# Patient Record
Sex: Male | Born: 1940 | Race: White | Hispanic: No | Marital: Married
Health system: Southern US, Community
[De-identification: ages and names within clinical notes are randomized; demographics above are authoritative.]

## PROBLEM LIST (undated history)

## (undated) DIAGNOSIS — E119 Type 2 diabetes mellitus without complications: Secondary | ICD-10-CM

## (undated) DIAGNOSIS — I10 Essential (primary) hypertension: Secondary | ICD-10-CM

## (undated) DIAGNOSIS — I340 Nonrheumatic mitral (valve) insufficiency: Secondary | ICD-10-CM

## (undated) DIAGNOSIS — J449 Chronic obstructive pulmonary disease, unspecified: Secondary | ICD-10-CM

## (undated) HISTORY — DX: Chronic obstructive pulmonary disease, unspecified: J44.9

## (undated) HISTORY — DX: Nonrheumatic mitral (valve) insufficiency: I34.0

## (undated) HISTORY — DX: Essential (primary) hypertension: I10

## (undated) HISTORY — DX: Type 2 diabetes mellitus without complications: E11.9

---

## 1997-12-18 ENCOUNTER — Ambulatory Visit (HOSPITAL_COMMUNITY): Admission: RE | Admit: 1997-12-18 | Discharge: 1997-12-18 | Payer: Self-pay | Admitting: Cardiology

## 1998-01-03 ENCOUNTER — Encounter: Admission: RE | Admit: 1998-01-03 | Discharge: 1998-04-03 | Payer: Self-pay | Admitting: Family Medicine

## 1998-06-25 ENCOUNTER — Ambulatory Visit (HOSPITAL_COMMUNITY): Admission: RE | Admit: 1998-06-25 | Discharge: 1998-06-25 | Payer: Self-pay | Admitting: Cardiology

## 1998-06-25 ENCOUNTER — Encounter: Payer: Self-pay | Admitting: Cardiology

## 2000-02-06 ENCOUNTER — Ambulatory Visit (HOSPITAL_COMMUNITY): Admission: RE | Admit: 2000-02-06 | Discharge: 2000-02-06 | Payer: Self-pay | Admitting: Gastroenterology

## 2000-08-18 ENCOUNTER — Encounter: Payer: Self-pay | Admitting: Cardiology

## 2000-08-18 ENCOUNTER — Encounter: Admission: RE | Admit: 2000-08-18 | Discharge: 2000-08-18 | Payer: Self-pay | Admitting: Cardiology

## 2000-08-19 ENCOUNTER — Encounter: Payer: Self-pay | Admitting: Cardiology

## 2000-08-19 ENCOUNTER — Encounter: Admission: RE | Admit: 2000-08-19 | Discharge: 2000-08-19 | Payer: Self-pay | Admitting: Cardiology

## 2001-01-05 ENCOUNTER — Encounter: Payer: Self-pay | Admitting: Cardiology

## 2001-01-05 ENCOUNTER — Encounter: Admission: RE | Admit: 2001-01-05 | Discharge: 2001-01-05 | Payer: Self-pay | Admitting: Cardiology

## 2001-04-07 ENCOUNTER — Inpatient Hospital Stay (HOSPITAL_COMMUNITY): Admission: AD | Admit: 2001-04-07 | Discharge: 2001-04-09 | Payer: Self-pay | Admitting: Cardiology

## 2001-05-12 ENCOUNTER — Inpatient Hospital Stay (HOSPITAL_COMMUNITY): Admission: EM | Admit: 2001-05-12 | Discharge: 2001-05-13 | Payer: Self-pay | Admitting: Emergency Medicine

## 2001-05-12 ENCOUNTER — Encounter: Payer: Self-pay | Admitting: *Deleted

## 2001-05-13 ENCOUNTER — Encounter: Payer: Self-pay | Admitting: *Deleted

## 2001-05-13 ENCOUNTER — Inpatient Hospital Stay (HOSPITAL_COMMUNITY): Admission: EM | Admit: 2001-05-13 | Discharge: 2001-05-16 | Payer: Self-pay | Admitting: Psychiatry

## 2001-05-17 ENCOUNTER — Other Ambulatory Visit (HOSPITAL_COMMUNITY): Admission: RE | Admit: 2001-05-17 | Discharge: 2001-05-21 | Payer: Self-pay | Admitting: Psychiatry

## 2001-07-26 ENCOUNTER — Encounter: Payer: Self-pay | Admitting: Emergency Medicine

## 2001-07-26 ENCOUNTER — Emergency Department (HOSPITAL_COMMUNITY): Admission: EM | Admit: 2001-07-26 | Discharge: 2001-07-26 | Payer: Self-pay | Admitting: Emergency Medicine

## 2001-11-17 ENCOUNTER — Encounter: Payer: Self-pay | Admitting: Internal Medicine

## 2001-11-17 ENCOUNTER — Ambulatory Visit (HOSPITAL_COMMUNITY): Admission: RE | Admit: 2001-11-17 | Discharge: 2001-11-17 | Payer: Self-pay | Admitting: Internal Medicine

## 2002-03-21 ENCOUNTER — Encounter: Payer: Self-pay | Admitting: Internal Medicine

## 2002-03-21 ENCOUNTER — Encounter: Admission: RE | Admit: 2002-03-21 | Discharge: 2002-03-21 | Payer: Self-pay | Admitting: Internal Medicine

## 2003-08-02 ENCOUNTER — Encounter: Admission: RE | Admit: 2003-08-02 | Discharge: 2003-08-02 | Payer: Self-pay | Admitting: Internal Medicine

## 2004-05-21 ENCOUNTER — Inpatient Hospital Stay (HOSPITAL_COMMUNITY): Admission: AD | Admit: 2004-05-21 | Discharge: 2004-05-23 | Payer: Self-pay | Admitting: Cardiology

## 2004-06-23 ENCOUNTER — Emergency Department (HOSPITAL_COMMUNITY): Admission: EM | Admit: 2004-06-23 | Discharge: 2004-06-24 | Payer: Self-pay | Admitting: Emergency Medicine

## 2004-06-26 ENCOUNTER — Encounter: Admission: RE | Admit: 2004-06-26 | Discharge: 2004-06-26 | Payer: Self-pay | Admitting: Urology

## 2004-06-27 ENCOUNTER — Ambulatory Visit (HOSPITAL_COMMUNITY): Admission: RE | Admit: 2004-06-27 | Discharge: 2004-06-27 | Payer: Self-pay | Admitting: Urology

## 2004-06-27 ENCOUNTER — Encounter (INDEPENDENT_AMBULATORY_CARE_PROVIDER_SITE_OTHER): Payer: Self-pay | Admitting: Specialist

## 2004-06-27 ENCOUNTER — Ambulatory Visit (HOSPITAL_BASED_OUTPATIENT_CLINIC_OR_DEPARTMENT_OTHER): Admission: RE | Admit: 2004-06-27 | Discharge: 2004-06-27 | Payer: Self-pay | Admitting: Urology

## 2005-12-15 ENCOUNTER — Inpatient Hospital Stay (HOSPITAL_COMMUNITY): Admission: EM | Admit: 2005-12-15 | Discharge: 2005-12-17 | Payer: Self-pay | Admitting: Emergency Medicine

## 2005-12-25 ENCOUNTER — Ambulatory Visit: Payer: Self-pay | Admitting: Psychiatry

## 2005-12-25 ENCOUNTER — Inpatient Hospital Stay (HOSPITAL_COMMUNITY): Admission: RE | Admit: 2005-12-25 | Discharge: 2005-12-29 | Payer: Self-pay | Admitting: Psychiatry

## 2007-01-14 ENCOUNTER — Ambulatory Visit (HOSPITAL_COMMUNITY): Admission: RE | Admit: 2007-01-14 | Discharge: 2007-01-14 | Payer: Self-pay | Admitting: Internal Medicine

## 2008-03-30 ENCOUNTER — Inpatient Hospital Stay (HOSPITAL_COMMUNITY): Admission: EM | Admit: 2008-03-30 | Discharge: 2008-04-03 | Payer: Self-pay | Admitting: Emergency Medicine

## 2008-03-30 ENCOUNTER — Ambulatory Visit: Payer: Self-pay | Admitting: Internal Medicine

## 2008-04-03 ENCOUNTER — Ambulatory Visit: Payer: Self-pay | Admitting: *Deleted

## 2008-04-03 ENCOUNTER — Inpatient Hospital Stay (HOSPITAL_COMMUNITY): Admission: RE | Admit: 2008-04-03 | Discharge: 2008-04-05 | Payer: Self-pay | Admitting: *Deleted

## 2009-12-14 ENCOUNTER — Inpatient Hospital Stay (HOSPITAL_COMMUNITY): Admission: EM | Admit: 2009-12-14 | Discharge: 2009-12-21 | Payer: Self-pay | Admitting: Emergency Medicine

## 2010-04-29 ENCOUNTER — Encounter: Payer: Self-pay | Admitting: Internal Medicine

## 2010-05-13 DIAGNOSIS — J449 Chronic obstructive pulmonary disease, unspecified: Secondary | ICD-10-CM | POA: Insufficient documentation

## 2010-05-13 DIAGNOSIS — I080 Rheumatic disorders of both mitral and aortic valves: Secondary | ICD-10-CM | POA: Insufficient documentation

## 2010-05-13 DIAGNOSIS — I428 Other cardiomyopathies: Secondary | ICD-10-CM | POA: Insufficient documentation

## 2010-05-13 DIAGNOSIS — J4489 Other specified chronic obstructive pulmonary disease: Secondary | ICD-10-CM | POA: Insufficient documentation

## 2010-05-13 DIAGNOSIS — I10 Essential (primary) hypertension: Secondary | ICD-10-CM | POA: Insufficient documentation

## 2010-05-13 DIAGNOSIS — E119 Type 2 diabetes mellitus without complications: Secondary | ICD-10-CM | POA: Insufficient documentation

## 2010-05-14 ENCOUNTER — Ambulatory Visit: Payer: Self-pay | Admitting: Internal Medicine

## 2010-05-21 NOTE — Letter (Signed)
Summary: [facility] - 2010 to 2012  [facility] - 2010 to 2012   Imported By: Marylou Mccoy 05/17/2010 14:05:49  _____________________________________________________________________  External Attachment:    Type:   Image     Comment:   External Document

## 2010-05-22 LAB — TSH: TSH: 1.841 u[IU]/mL (ref 0.350–4.500)

## 2010-05-22 LAB — CARDIAC PANEL(CRET KIN+CKTOT+MB+TROPI)
CK, MB: 4.4 ng/mL — ABNORMAL HIGH (ref 0.3–4.0)
Relative Index: 2.5 (ref 0.0–2.5)
Total CK: 176 U/L (ref 7–232)
Troponin I: 0.09 ng/mL — ABNORMAL HIGH (ref 0.00–0.06)

## 2010-05-22 LAB — URINALYSIS, ROUTINE W REFLEX MICROSCOPIC
Glucose, UA: >1000 mg/dL — AB
Ketones, ur: 15 mg/dL — AB
Leukocytes, UA: NEGATIVE
Nitrite: NEGATIVE
Protein, ur: NEGATIVE mg/dL

## 2010-05-22 LAB — HEPATIC FUNCTION PANEL
AST: 37 U/L (ref 0–37)
Bilirubin, Direct: 0.3 mg/dL (ref 0.0–0.3)
Indirect Bilirubin: 0.8 mg/dL (ref 0.3–0.9)

## 2010-05-22 LAB — CBC
HCT: 31.8 % — ABNORMAL LOW (ref 39.0–52.0)
HCT: 34.2 % — ABNORMAL LOW (ref 39.0–52.0)
HCT: 36.4 % — ABNORMAL LOW (ref 39.0–52.0)
Hemoglobin: 10.6 g/dL — ABNORMAL LOW (ref 13.0–17.0)
Hemoglobin: 11 g/dL — ABNORMAL LOW (ref 13.0–17.0)
Hemoglobin: 11.4 g/dL — ABNORMAL LOW (ref 13.0–17.0)
Hemoglobin: 12.4 g/dL — ABNORMAL LOW (ref 13.0–17.0)
MCH: 30.1 pg (ref 26.0–34.0)
MCH: 31.5 pg (ref 26.0–34.0)
MCHC: 33.3 g/dL (ref 30.0–36.0)
MCHC: 34.1 g/dL (ref 30.0–36.0)
MCHC: 35 g/dL (ref 30.0–36.0)
MCV: 87.8 fL (ref 78.0–100.0)
MCV: 90.5 fL (ref 78.0–100.0)
MCV: 90.5 fL (ref 78.0–100.0)
Platelets: 137 10*3/uL — ABNORMAL LOW (ref 150–400)
Platelets: 163 10*3/uL (ref 150–400)
RBC: 3.65 MIL/uL — ABNORMAL LOW (ref 4.22–5.81)
RBC: 4.29 MIL/uL (ref 4.22–5.81)
RDW: 13.8 % (ref 11.5–15.5)
RDW: 13.9 % (ref 11.5–15.5)
WBC: 12.1 10*3/uL — ABNORMAL HIGH (ref 4.0–10.5)
WBC: 9.8 10*3/uL (ref 4.0–10.5)
WBC: 9.9 10*3/uL (ref 4.0–10.5)

## 2010-05-22 LAB — BRAIN NATRIURETIC PEPTIDE
Pro B Natriuretic peptide (BNP): 1206 pg/mL — ABNORMAL HIGH (ref 0.0–100.0)
Pro B Natriuretic peptide (BNP): 895 pg/mL — ABNORMAL HIGH (ref 0.0–100.0)

## 2010-05-22 LAB — BASIC METABOLIC PANEL
BUN: 19 mg/dL (ref 6–23)
BUN: 19 mg/dL (ref 6–23)
BUN: 30 mg/dL — ABNORMAL HIGH (ref 6–23)
CO2: 28 mEq/L (ref 19–32)
CO2: 29 mEq/L (ref 19–32)
Calcium: 7.9 mg/dL — ABNORMAL LOW (ref 8.4–10.5)
Calcium: 8.1 mg/dL — ABNORMAL LOW (ref 8.4–10.5)
Chloride: 101 mEq/L (ref 96–112)
Chloride: 98 mEq/L (ref 96–112)
Chloride: 99 mEq/L (ref 96–112)
Creatinine, Ser: 0.86 mg/dL (ref 0.4–1.5)
Creatinine, Ser: 0.97 mg/dL (ref 0.4–1.5)
Creatinine, Ser: 0.99 mg/dL (ref 0.4–1.5)
GFR calc Af Amer: >60 mL/min (ref >60)
GFR calc Af Amer: >60 mL/min (ref >60)
GFR calc non Af Amer: >60 mL/min (ref >60)
GFR calc non Af Amer: >60 mL/min (ref >60)
GFR calc non Af Amer: >60 mL/min (ref >60)
GFR calc non Af Amer: >60 mL/min (ref >60)
GFR calc non Af Amer: >60 mL/min (ref >60)
Glucose, Bld: 188 mg/dL — ABNORMAL HIGH (ref 70–99)
Glucose, Bld: 234 mg/dL — ABNORMAL HIGH (ref 70–99)
Glucose, Bld: 301 mg/dL — ABNORMAL HIGH (ref 70–99)
Glucose, Bld: 310 mg/dL — ABNORMAL HIGH (ref 70–99)
Glucose, Bld: 311 mg/dL — ABNORMAL HIGH (ref 70–99)
Potassium: 3.9 mEq/L (ref 3.5–5.1)
Potassium: 4.1 mEq/L (ref 3.5–5.1)
Potassium: 4.1 mEq/L (ref 3.5–5.1)
Potassium: 4.5 mEq/L (ref 3.5–5.1)
Sodium: 127 mEq/L — ABNORMAL LOW (ref 135–145)
Sodium: 128 mEq/L — ABNORMAL LOW (ref 135–145)
Sodium: 131 mEq/L — ABNORMAL LOW (ref 135–145)
Sodium: 136 mEq/L (ref 135–145)
Sodium: 137 mEq/L (ref 135–145)

## 2010-05-22 LAB — GLUCOSE, CAPILLARY
Glucose-Capillary: 154 mg/dL — ABNORMAL HIGH (ref 70–99)
Glucose-Capillary: 180 mg/dL — ABNORMAL HIGH (ref 70–99)
Glucose-Capillary: 189 mg/dL — ABNORMAL HIGH (ref 70–99)
Glucose-Capillary: 192 mg/dL — ABNORMAL HIGH (ref 70–99)
Glucose-Capillary: 233 mg/dL — ABNORMAL HIGH (ref 70–99)
Glucose-Capillary: 246 mg/dL — ABNORMAL HIGH (ref 70–99)
Glucose-Capillary: 257 mg/dL — ABNORMAL HIGH (ref 70–99)
Glucose-Capillary: 264 mg/dL — ABNORMAL HIGH (ref 70–99)
Glucose-Capillary: 279 mg/dL — ABNORMAL HIGH (ref 70–99)
Glucose-Capillary: 289 mg/dL — ABNORMAL HIGH (ref 70–99)
Glucose-Capillary: 293 mg/dL — ABNORMAL HIGH (ref 70–99)
Glucose-Capillary: 296 mg/dL — ABNORMAL HIGH (ref 70–99)
Glucose-Capillary: 385 mg/dL — ABNORMAL HIGH (ref 70–99)

## 2010-05-22 LAB — CK TOTAL AND CKMB (NOT AT ARMC)
CK, MB: 4.1 ng/mL — ABNORMAL HIGH (ref 0.3–4.0)
Relative Index: 3.3 — ABNORMAL HIGH (ref 0.0–2.5)

## 2010-05-22 LAB — URINE MICROSCOPIC-ADD ON

## 2010-05-22 LAB — POCT I-STAT, CHEM 8
Calcium, Ion: 1.03 mmol/L — ABNORMAL LOW (ref 1.12–1.32)
HCT: 42 % (ref 39.0–52.0)
TCO2: 21 mmol/L (ref 0–100)

## 2010-05-22 LAB — PROTIME-INR
INR: 1.07 (ref 0.00–1.49)
INR: 1.09 (ref 0.00–1.49)
Prothrombin Time: 14.1 seconds (ref 11.6–15.2)
Prothrombin Time: 14.3 seconds (ref 11.6–15.2)

## 2010-05-22 LAB — POCT CARDIAC MARKERS
CKMB, poc: 3.1 ng/mL (ref 1.0–8.0)
CKMB, poc: 3.2 ng/mL (ref 1.0–8.0)
Myoglobin, poc: 113 ng/mL (ref 12–200)
Troponin i, poc: <0.05 ng/mL (ref 0.00–0.09)

## 2010-05-22 LAB — DIFFERENTIAL
Basophils Relative: 0 % (ref 0–1)
Eosinophils Absolute: 0 10*3/uL (ref 0.0–0.7)
Neutrophils Relative %: 86 % — ABNORMAL HIGH (ref 43–77)

## 2010-05-22 LAB — HEPARIN LEVEL (UNFRACTIONATED): Heparin Unfractionated: 0.16 IU/mL — ABNORMAL LOW (ref 0.30–0.70)

## 2010-05-22 LAB — DIGOXIN LEVEL: Digoxin Level: <0.2 ng/mL — ABNORMAL LOW (ref 0.8–2.0)

## 2010-05-22 LAB — MAGNESIUM: Magnesium: 1.9 mg/dL (ref 1.5–2.5)

## 2010-05-22 LAB — D-DIMER, QUANTITATIVE
D-Dimer, Quant: 0.93 ug/mL-FEU — ABNORMAL HIGH (ref 0.00–0.48)
D-Dimer, Quant: 1.47 ug/mL-FEU — ABNORMAL HIGH (ref 0.00–0.48)

## 2010-05-22 LAB — HEMOGLOBIN A1C: Hgb A1c MFr Bld: 10.1 % — ABNORMAL HIGH (ref <5.7)

## 2010-05-22 LAB — MRSA PCR SCREENING: MRSA by PCR: NEGATIVE

## 2010-05-26 ENCOUNTER — Inpatient Hospital Stay (HOSPITAL_COMMUNITY)
Admission: EM | Admit: 2010-05-26 | Discharge: 2010-05-31 | DRG: 227 | Disposition: A | Discharge status: Home / Self Care | Payer: Medicare Other | Attending: Cardiology | Admitting: Cardiology

## 2010-05-26 ENCOUNTER — Emergency Department (HOSPITAL_COMMUNITY): Payer: Medicare Other

## 2010-05-26 DIAGNOSIS — F1021 Alcohol dependence, in remission: Secondary | ICD-10-CM | POA: Diagnosis present

## 2010-05-26 DIAGNOSIS — Z8249 Family history of ischemic heart disease and other diseases of the circulatory system: Secondary | ICD-10-CM

## 2010-05-26 DIAGNOSIS — Z833 Family history of diabetes mellitus: Secondary | ICD-10-CM

## 2010-05-26 DIAGNOSIS — I442 Atrioventricular block, complete: Secondary | ICD-10-CM | POA: Diagnosis present

## 2010-05-26 DIAGNOSIS — Z9981 Dependence on supplemental oxygen: Secondary | ICD-10-CM

## 2010-05-26 DIAGNOSIS — Z860100 Personal history of colon polyps, unspecified: Secondary | ICD-10-CM

## 2010-05-26 DIAGNOSIS — Z87891 Personal history of nicotine dependence: Secondary | ICD-10-CM

## 2010-05-26 DIAGNOSIS — E781 Pure hyperglyceridemia: Secondary | ICD-10-CM | POA: Diagnosis present

## 2010-05-26 DIAGNOSIS — J4489 Other specified chronic obstructive pulmonary disease: Secondary | ICD-10-CM | POA: Diagnosis present

## 2010-05-26 DIAGNOSIS — J449 Chronic obstructive pulmonary disease, unspecified: Secondary | ICD-10-CM | POA: Diagnosis present

## 2010-05-26 DIAGNOSIS — I2510 Atherosclerotic heart disease of native coronary artery without angina pectoris: Secondary | ICD-10-CM | POA: Diagnosis present

## 2010-05-26 DIAGNOSIS — Z7982 Long term (current) use of aspirin: Secondary | ICD-10-CM

## 2010-05-26 DIAGNOSIS — E1139 Type 2 diabetes mellitus with other diabetic ophthalmic complication: Secondary | ICD-10-CM | POA: Diagnosis present

## 2010-05-26 DIAGNOSIS — F319 Bipolar disorder, unspecified: Secondary | ICD-10-CM | POA: Diagnosis present

## 2010-05-26 DIAGNOSIS — I447 Left bundle-branch block, unspecified: Secondary | ICD-10-CM | POA: Diagnosis present

## 2010-05-26 DIAGNOSIS — Z8551 Personal history of malignant neoplasm of bladder: Secondary | ICD-10-CM

## 2010-05-26 DIAGNOSIS — I959 Hypotension, unspecified: Secondary | ICD-10-CM | POA: Diagnosis present

## 2010-05-26 DIAGNOSIS — I428 Other cardiomyopathies: Secondary | ICD-10-CM | POA: Diagnosis present

## 2010-05-26 DIAGNOSIS — E1149 Type 2 diabetes mellitus with other diabetic neurological complication: Secondary | ICD-10-CM | POA: Diagnosis present

## 2010-05-26 DIAGNOSIS — E1142 Type 2 diabetes mellitus with diabetic polyneuropathy: Secondary | ICD-10-CM | POA: Diagnosis present

## 2010-05-26 DIAGNOSIS — I10 Essential (primary) hypertension: Secondary | ICD-10-CM | POA: Diagnosis present

## 2010-05-26 DIAGNOSIS — E11319 Type 2 diabetes mellitus with unspecified diabetic retinopathy without macular edema: Secondary | ICD-10-CM | POA: Diagnosis present

## 2010-05-26 DIAGNOSIS — Z794 Long term (current) use of insulin: Secondary | ICD-10-CM

## 2010-05-26 DIAGNOSIS — I5023 Acute on chronic systolic (congestive) heart failure: Principal | ICD-10-CM | POA: Diagnosis present

## 2010-05-26 LAB — TROPONIN I: Troponin I: 0.04 ng/mL (ref 0.00–0.06)

## 2010-05-26 LAB — DIFFERENTIAL
Basophils Absolute: 0 10*3/uL (ref 0.0–0.1)
Basophils Relative: 0 % (ref 0–1)
Neutro Abs: 11.6 10*3/uL — ABNORMAL HIGH (ref 1.7–7.7)
Neutrophils Relative %: 87 % — ABNORMAL HIGH (ref 43–77)

## 2010-05-26 LAB — CK TOTAL AND CKMB (NOT AT ARMC)
Relative Index: INVALID (ref 0.0–2.5)
Total CK: 94 U/L (ref 7–232)

## 2010-05-26 LAB — POCT I-STAT, CHEM 8
HCT: 44 % (ref 39.0–52.0)
Hemoglobin: 15 g/dL (ref 13.0–17.0)
Potassium: 5.9 mEq/L — ABNORMAL HIGH (ref 3.5–5.1)
Sodium: 127 mEq/L — ABNORMAL LOW (ref 135–145)

## 2010-05-26 LAB — COMPREHENSIVE METABOLIC PANEL
Alkaline Phosphatase: 97 U/L (ref 39–117)
BUN: 25 mg/dL — ABNORMAL HIGH (ref 6–23)
Chloride: 94 mEq/L — ABNORMAL LOW (ref 96–112)
GFR calc non Af Amer: >60 mL/min (ref >60)
Glucose, Bld: 243 mg/dL — ABNORMAL HIGH (ref 70–99)
Potassium: 6.4 mEq/L (ref 3.5–5.1)
Total Bilirubin: 2.1 mg/dL — ABNORMAL HIGH (ref 0.3–1.2)
Total Protein: 6.9 g/dL (ref 6.0–8.3)

## 2010-05-26 LAB — CBC
Hemoglobin: 14.3 g/dL (ref 13.0–17.0)
Platelets: 151 10*3/uL (ref 150–400)
RBC: 4.75 MIL/uL (ref 4.22–5.81)

## 2010-05-26 LAB — POTASSIUM: Potassium: 4.4 mEq/L (ref 3.5–5.1)

## 2010-05-26 LAB — POCT CARDIAC MARKERS: Troponin i, poc: <0.05 ng/mL (ref 0.00–0.09)

## 2010-05-26 LAB — CARDIAC PANEL(CRET KIN+CKTOT+MB+TROPI): Troponin I: 0.03 ng/mL (ref 0.00–0.06)

## 2010-05-26 LAB — MRSA PCR SCREENING: MRSA by PCR: NEGATIVE

## 2010-05-26 LAB — GLUCOSE, CAPILLARY

## 2010-05-27 ENCOUNTER — Inpatient Hospital Stay (HOSPITAL_COMMUNITY): Payer: Medicare Other

## 2010-05-27 DIAGNOSIS — I509 Heart failure, unspecified: Secondary | ICD-10-CM

## 2010-05-27 DIAGNOSIS — I447 Left bundle-branch block, unspecified: Secondary | ICD-10-CM

## 2010-05-27 LAB — CBC
MCH: 29.2 pg (ref 26.0–34.0)
MCHC: 33.2 g/dL (ref 30.0–36.0)
Platelets: 151 10*3/uL (ref 150–400)
RBC: 4.14 MIL/uL — ABNORMAL LOW (ref 4.22–5.81)
RDW: 14.6 % (ref 11.5–15.5)

## 2010-05-27 LAB — GLUCOSE, CAPILLARY
Glucose-Capillary: 234 mg/dL — ABNORMAL HIGH (ref 70–99)
Glucose-Capillary: 220 mg/dL — ABNORMAL HIGH (ref 70–99)

## 2010-05-27 LAB — BASIC METABOLIC PANEL
CO2: 27 mEq/L (ref 19–32)
Calcium: 8.1 mg/dL — ABNORMAL LOW (ref 8.4–10.5)
Chloride: 96 mEq/L (ref 96–112)
GFR calc Af Amer: >60 mL/min (ref >60)
Sodium: 130 mEq/L — ABNORMAL LOW (ref 135–145)

## 2010-05-27 LAB — CARDIAC PANEL(CRET KIN+CKTOT+MB+TROPI)
Relative Index: INVALID (ref 0.0–2.5)
Total CK: 75 U/L (ref 7–232)
Troponin I: 0.04 ng/mL (ref 0.00–0.06)
Troponin I: 0.04 ng/mL (ref 0.00–0.06)

## 2010-05-28 DIAGNOSIS — I5023 Acute on chronic systolic (congestive) heart failure: Secondary | ICD-10-CM

## 2010-05-28 LAB — BASIC METABOLIC PANEL
CO2: 32 mEq/L (ref 19–32)
Chloride: 99 mEq/L (ref 96–112)
Creatinine, Ser: 0.83 mg/dL (ref 0.4–1.5)
GFR calc Af Amer: >60 mL/min (ref >60)
Sodium: 136 mEq/L (ref 135–145)

## 2010-05-28 LAB — GLUCOSE, CAPILLARY
Glucose-Capillary: 203 mg/dL — ABNORMAL HIGH (ref 70–99)
Glucose-Capillary: 222 mg/dL — ABNORMAL HIGH (ref 70–99)
Glucose-Capillary: 257 mg/dL — ABNORMAL HIGH (ref 70–99)

## 2010-05-28 LAB — CBC
Hemoglobin: 12.6 g/dL — ABNORMAL LOW (ref 13.0–17.0)
MCH: 29.2 pg (ref 26.0–34.0)
Platelets: 195 10*3/uL (ref 150–400)
RBC: 4.32 MIL/uL (ref 4.22–5.81)
WBC: 10.3 10*3/uL (ref 4.0–10.5)

## 2010-05-29 ENCOUNTER — Inpatient Hospital Stay (HOSPITAL_COMMUNITY): Payer: Medicare Other

## 2010-05-29 DIAGNOSIS — I428 Other cardiomyopathies: Secondary | ICD-10-CM

## 2010-05-29 LAB — COMPREHENSIVE METABOLIC PANEL
CO2: 29 mEq/L (ref 19–32)
Calcium: 8.7 mg/dL (ref 8.4–10.5)
Creatinine, Ser: 0.73 mg/dL (ref 0.4–1.5)
GFR calc non Af Amer: >60 mL/min (ref >60)
Glucose, Bld: 149 mg/dL — ABNORMAL HIGH (ref 70–99)
Total Protein: 6.3 g/dL (ref 6.0–8.3)

## 2010-05-29 LAB — CBC
HCT: 39.8 % (ref 39.0–52.0)
Hemoglobin: 13.1 g/dL (ref 13.0–17.0)
MCH: 29.4 pg (ref 26.0–34.0)
MCHC: 32.9 g/dL (ref 30.0–36.0)
RDW: 14.5 % (ref 11.5–15.5)

## 2010-05-29 LAB — GLUCOSE, CAPILLARY
Glucose-Capillary: 200 mg/dL — ABNORMAL HIGH (ref 70–99)
Glucose-Capillary: 140 mg/dL — ABNORMAL HIGH (ref 70–99)

## 2010-05-29 LAB — BRAIN NATRIURETIC PEPTIDE: Pro B Natriuretic peptide (BNP): 107 pg/mL — ABNORMAL HIGH (ref 0.0–100.0)

## 2010-05-30 ENCOUNTER — Inpatient Hospital Stay (HOSPITAL_COMMUNITY): Payer: Medicare Other

## 2010-05-30 LAB — GLUCOSE, CAPILLARY
Glucose-Capillary: 136 mg/dL — ABNORMAL HIGH (ref 70–99)
Glucose-Capillary: 236 mg/dL — ABNORMAL HIGH (ref 70–99)

## 2010-05-30 LAB — BASIC METABOLIC PANEL
BUN: 21 mg/dL (ref 6–23)
CO2: 30 mEq/L (ref 19–32)
Chloride: 101 mEq/L (ref 96–112)
Creatinine, Ser: 0.88 mg/dL (ref 0.4–1.5)
GFR calc Af Amer: >60 mL/min (ref >60)
Glucose, Bld: 199 mg/dL — ABNORMAL HIGH (ref 70–99)

## 2010-05-30 LAB — CBC
HCT: 39.1 % (ref 39.0–52.0)
Hemoglobin: 12.5 g/dL — ABNORMAL LOW (ref 13.0–17.0)
MCH: 28.9 pg (ref 26.0–34.0)
MCHC: 32 g/dL (ref 30.0–36.0)
MCV: 90.3 fL (ref 78.0–100.0)
RBC: 4.33 MIL/uL (ref 4.22–5.81)

## 2010-05-31 LAB — GLUCOSE, CAPILLARY
Glucose-Capillary: 198 mg/dL — ABNORMAL HIGH (ref 70–99)
Glucose-Capillary: 146 mg/dL — ABNORMAL HIGH (ref 70–99)

## 2010-05-31 LAB — BASIC METABOLIC PANEL
CO2: 30 mEq/L (ref 19–32)
GFR calc non Af Amer: >60 mL/min (ref >60)
Glucose, Bld: 148 mg/dL — ABNORMAL HIGH (ref 70–99)
Potassium: 4.2 mEq/L (ref 3.5–5.1)
Sodium: 137 mEq/L (ref 135–145)

## 2010-05-31 LAB — CBC
MCH: 28.2 pg (ref 26.0–34.0)
MCV: 89 fL (ref 78.0–100.0)

## 2010-06-01 LAB — CULTURE, BLOOD (ROUTINE X 2)
Culture  Setup Time: 201203181923
Culture  Setup Time: 201203181923
Culture: NO GROWTH

## 2010-06-04 NOTE — Discharge Summary (Signed)
NAME:  Gregory Horne, Gregory Horne NO.:  [id number]  MEDICAL RECORD NO.:  [id number]           PATIENT TYPE:  I  LOCATION:  2920                         FACILITY:  [facility]  PHYSICIAN:  Ritta Slot, MD     DATE OF BIRTH:  14-Jul-1940  DATE OF ADMISSION:  05/26/2010 DATE OF DISCHARGE:  05/31/2010                              DISCHARGE SUMMARY   DISCHARGE DIAGNOSES: 1. Acute on chronic systolic heart failure, improving at discharge. 2. Nonischemic cardiomyopathy, ejection fraction 25%. 3. Resolving hypotension. 4. Diabetes mellitus insulin dependent and followed by Dr. Clelia Croft. 5. History of permanent transvenous pacemaker now upgraded to a Bi-V     ICD on May 29, 2010 with a [employer]. Jude [place]. 6. Oxygen desaturation.  Going home with home oxygen for oxygen levels     of 85% with ambulation on room air. 7. Chronic left bundle branch block induced by right ventricular     pacing. 8. History of bipolar disorder.9. History of bladder cancer.Marland Kitchen  DISCHARGE CONDITION:  Improved.  PROCEDURES:  Upgrade of permanent transvenous pacemaker to a Bi-V ICD with [employer] St[employer] Jude device by Dr. Lewayne Bunting on May 29, 2010.  DISCHARGE MEDICATIONS:  See medication reconciliation sheet.  DISCHARGE INSTRUCTIONS: 1. O2 2 liters nasal cannula continuously. 2. Activity as per pacemaker defibrillator instruction sheet. 3. Increase activity slowly. 4. Low-sodium heart-healthy diabetic diet. 5. Follow up with Dr. Lubertha Basque office for wound check June 10, 2010 at     4:30 p.m. 6. Follow up with Dr. Ladona Ridgel on June 30 at 9:30 a.m. 7. Follow up with Dr. Clarene Duke, [facility] June 17, 2010 at 2:15 p.m.     and then follow up with Dr. Eric Form in 2-3 weeks.  Call his     office for appointment. 8. If you have problems before your appointment with Dr. Clarene Duke, call     the office to be seen sooner. 9. Record daily weights. 10.Check sugar before each meal and bring meter to visit for Dr.  Clelia Croft.  HOSPITAL COURSE:  A 70 year old white male with nonischemic cardiomyopathy, EF around 25% was discharged from [facility] on February 15 after an acute exacerbation of heart failure.  He was seen by Dr. Alanda Amass in the office on the 16th for again acute exacerbation of heart failure.  His diuretic was increased and he was being considered for Bi-V upgrade.  He had never improved to a great deal from his heart failure and he was followed up with Dr. Clarene Duke and then Dr. Ladona Ridgel for upgrading in his pacemaker to a Bi-V ICD but on May 26, 2010, he was presented to the emergency room with worsening shortness of breath, orthopnea, PND, and heart failure exacerbation.  His Lasix was changed to IV 80 mg twice a day.  He was put on nitroglycerin drip and the plan was to upgrade to Bi-V ICD.  By the next day, dobutamine was added to help him diurese and Dr. Clelia Croft was asked to see for help with diabetes management.  Dr. Ladona Ridgel saw him for EP as well.  His insulin was adjusted by Dr. Clelia Croft.  Dr. Ladona Ridgel saw  him and felt Bi-V upgrade once hisheart failure was improved.  His dobutamine he was diuresing well.  On the 20th, he was negative 4 liters, continued to improve now.  He was also given heart failure education through the pharmacy.  By the 21st, he underwent upgrade Bi-V ICD, tolerated that well.  No hematoma. By the 22nd, continued to improve and by the 23rd, he was seen by Dr. Lynnea Ferrier and felt ready for discharge.  The patient was quite anxious to be discharged.  He did continue to desat with ambulation down to 85%. Therefore, we felt it appropriate to discharge him with oxygen at 3 liters nasal cannula continuously.  Dr. Clelia Croft saw him on the 23rd and adjusted his Lantus as well as his NovoLog insulin which he had had at home as well prior to this admission.  These are just new dosages for him and Dr. Graciela Husbands saw him for Dr. Ladona Ridgel.  The site was doing well and they would follow up for their visits  and then turn him back over for interrogations with [facility] once the site was well-healed and at Dr. Fredirick Maudlin discretion.  LABORATORY DATA:  Hemoglobin at discharge 12.3, hematocrit 38.8, WBC 8.9, and platelets 221,000.  Glucose 150 up to 236.  Chemistry at discharge:  Sodium 137, potassium 4.2, chloride 99, CO2 30, glucose 148, BUN 19, creatinine 0.86, GFR is greater than 60.  Calcium 8.7.  Hemoglobin A1c was 10.2.  BNP on admission was 559 and prior to discharge it is 102.  MRSA screen was negative.  Blood cultures had been done and both were negative x2.  Cardiac enzymes were also negative with CKs 97, 75, and 92 with MBs 7, 5.5, and 7.2 and troponin I 0.03, 0.04, and 0.04.  Chest x-ray on admission, cardiomegaly with diffuse interstitial and airspace pattern suggesting edema and failure, small bilateral pleural effusions, asymmetric density in the right middle lobe.  This raises concern for superimposed pneumonia, change in the configuration of atrial pacing lead.  This may have been exchanged in the interval or the pacing lead may have shifted.  Followup on the 19th revealed congestive heart failure.  On the 21st, enlarged pericardial silhouette with globular configuration, pericardial effusion is considered, pulmonary edema, and follow up on the 22nd after the improved aeration, permanent pacer with a ICD now present, no pneumothorax.  EKGs, AV pacing at discharge.     Darcella Gasman. Annie Paras, N.P.   ______________________________ Ritta Slot, MD    LRI/MEDQ  D:  05/31/2010  T:  06/01/2010  Job:  [id number]  cc:   Thereasa Solo. Little, M.D. Ritta Slot, MD Kari Baars, M.D. Doylene Canning. Ladona Ridgel, MD  Electronically Signed by Nada Boozer N.P. on 06/04/2010 11:38:45 AM Electronically Signed by Ritta Slot MD on 06/04/2010 02:43:27 PM

## 2010-06-04 NOTE — H&P (Signed)
NAME:  Gregory Horne, Gregory Horne NO.:  [id number]  MEDICAL RECORD NO.:  [id number]           PATIENT TYPE:  I  LOCATION:  2920                         FACILITY:  [facility]  PHYSICIAN:  Ritta Slot, MD     DATE OF BIRTH:  [birth year]  DATE OF ADMISSION:  05/26/2010 DATE OF DISCHARGE:                             HISTORY & PHYSICAL   ADMISSION DIAGNOSES: 1. Acute-on-chronic systolic heart failure. 2. Nonischemic cardiomyopathy, ejection fraction of 25%. 3. Chronic obstructive pulmonary disease. 4. Diabetes mellitus type 2. 5. 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 6. Chronic left bundle-branch block induced by right ventricular     pacing 100%. 7. History of bipolar disorder, resolved. 8. History of bladder cancer.  HISTORY:  Gregory Horne is a very pleasant 70 year old white gentleman with a nonischemic cardiomyopathy, EF around 25%, who was recently discharged from hospital on April 24, 2010 for an acute exacerbation of CHF.  He saw Dr. Alanda Amass in the office, April 25, 2010, for acute exacerbation of heart failure at which time, he increased his diuretic dose and was actually at that time, considering him for BiV upgrade which hopefully would minimize his CHF exacerbations.  He really has not been well since seeing Dr. Alanda Amass.  He is seeing Dr. Clarene Duke in the office subsequently and has been due to see Dr. Lewayne Bunting of [facility] for updates for BiV ICD.  However, he is now back here with worsening shortness of breath, orthopnea, PND, and CHF exacerbation.  PAST MEDICAL HISTORY:  As above.  CURRENT MEDICATIONS: 1. Aspirin. 2. Lisinopril 10 mg daily. 3. Lasix 20 mg. 4. Zoloft 200 mg daily. 5. Fish oil. 6. Digoxin 0.125 mg daily. 7. Lantus insulin 60 units in the morning, 70 units in the evening. 8. Losartan 25 mg b.i.d. 9. Lopressor 25 mg daily. 10.Sertraline 100 mg b.i.d. 11.Ambien 10 mg daily. 12.Atrovent  p.r.n.  ALLERGIES:  No known drug allergies.  SOCIAL HISTORY:  He had suicide attempts in the past, was hospitalized at psychiatric facility at [facility] and living in assisted living facility.  Currently, living with his son and is divorced.  REVIEW OF SYSTEMS:  I am not sure 2 to 3 times a week with exacerbation COPD and shortness of breath.  PHYSICAL EXAMINATION:  GENERAL:  He is short of breath at rest. VITAL SIGNS:  His O2 sat 93% on 2.5 L, his pulse rate is ventricular pacing at 100 beats per minute.  His respiration rate is 31, his blood pressure is 121/86. HEENT:  Head is atraumatic, normocephalic. NECK:  Supple.  Full range of movements.  He had mild jugular venous distention.  No carotid bruits are noted. CARDIOVASCULAR:  Heart sounds 1 and 2 heard.  No murmurs, rubs, or gallops. LUNGS:  He had bilateral wheezes and creps to his mid lungs. ABDOMEN:  Obese, soft, nontender.  No hepatosplenomegaly.  Bowel sounds present.  No AAA noted.  No abdominal bruits heard. EXTREMITIES:  He had mild edema bilaterally to his ankles.Chest x-ray today showed significant interstitial edema.  His lab work today showed white count of 13.4, hemoglobin 14.3, crit of  40.7, platelet count of 151.  Sodium 127, potassium 4.4, chloride 97, bicarb 22, glucose 226, BUN 39, creatinine 0.9.  Total bilirubin 2.1, AST 93, ALT 87, albumin 3.4.  BNP 559, troponin less than 0.05.  A 12-lead ECG showed a ventricularly paced rhythm.  PLAN:  I do believe he needs to have Lasix 80 IV mg b.i.d.  I think we will need to start metoprolol 25 mg p.o. b.i.d. as well as lisinopril 20 mg p.o. b.i.d.  We will need to start him on nitrate drip.  We will need to diurese him.  We will also need to ask Dr. Robin Searing to come by and see him and see if he can upgrade him to a BiV ICD this admission.  This is now his second admission for heart failure within 60-month period and he is really not doing very well in the  outpatient setting.  We will make these recommendations, he will be admitted to the step-down tele bed.     Ritta Slot, MD     HS/MEDQ  D:  05/26/2010  T:  05/27/2010  Job:  [id number]  Electronically Signed by Ritta Slot MD on 06/04/2010 02:43:23 PM

## 2010-06-10 ENCOUNTER — Ambulatory Visit (INDEPENDENT_AMBULATORY_CARE_PROVIDER_SITE_OTHER): Payer: Medicare Other | Admitting: *Deleted

## 2010-06-10 DIAGNOSIS — I428 Other cardiomyopathies: Secondary | ICD-10-CM

## 2010-06-11 NOTE — Consult Note (Signed)
NAME:  Gregory Horne, Gregory Horne NO.:  [id number]  MEDICAL RECORD NO.:  [id number]           PATIENT TYPE:  I  LOCATION:  2920                         FACILITY:  [facility]  PHYSICIAN:  Larina Earthly, M.D.        DATE OF BIRTH:  1940/10/16  DATE OF CONSULTATION:  05/26/2010 DATE OF DISCHARGE:                                CONSULTATION   Consultation requested by Dr. Cheri Guppy.  REASON FOR CONSULTATION:  Medical management and initial ER evaluation.  HISTORY OF PRESENT ILLNESS:  This is a 70 year old Caucasian male who is followed by Dr. Terie Purser at [facility] as well as Dr. 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  This patient has been evaluated by his cardiologist on a regular basis and does have a pacemaker and despite his current medications, he has had progressive issues with orthopnea, shortness of breath, dyspnea on exertion, and indeed most recent note from Cardiology reveals that they are considering a biventricular pacemaker in replacement for this individual.  He presented to the emergency room with persistent dyspnea on exertion, shortness of breath, and orthopnea despite using 40 mg of furosemide on a daily basis.  Initial presentation in the emergency room was suggestive of congestive heart failure and volume overload and was given an initial dose of IV diuretics.  However, chest x-ray did also reveal right middle lobe pneumonia on top of superimposed pulmonary edema and I was called to admit the patient.  Upon my arrival, the patient in significant respiratory distress, sitting on the edge of bed with increased respiratory rate, BNP was in the neighborhood of 500. Ordered another dose of additional furosemide  at 40 mg and discussed the case with Dr. Ritta Slot who was covering for Dr. Clarene Duke.  He further evaluated the patient and thought that possible admission to a step-down unit with IV diuretics and IV nitroglycerin as well as further question and consideration of biventricular upgrade and ICD therapy should be considered.  In talking with the patient, he does not admit to any significant fevers, chills, or productive cough, wheezing, but does admit to just progressive dyspnea on exertion.  No chest pain and worsening orthopnea, indeed he has been unable to sleep in his bed with his normal three pillows and has been sleeping in a recliner for the last 3-4 days.  Appetite has been somewhat protest, but no overt nausea or vomiting.  He continues to live with his 20 year old mother.  REVIEW OF SYSTEMS:  Negative for any new musculoskeletal difficulties. Negative for suicidal ideation at this time.  Negative for fevers, chills, purulent sputum production.  Negative for change in bowel habits, blood in stool or urine.  Positive for worsening lower extremity edema.  Negative for focal neurological deficits.  PROBLEM LIST: 1. 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 2. Congestive heart failure with nonischemic cardiomyopathy, EF 25%. 3. Hypertension. 4. Hyperlipidemia with hypertriglyceridemia. 5. Depression with suicide attempt in 2006,  2007, and 2010. 6. Bladder cancer in 2006. 7. Chronic obstructive pulmonary disease with severe PFTs in 2008. 8. Colon polyps in 2001. 9. Elevated liver function tests.  SOCIAL HISTORY:  The patient is divorced, has 3 children, 8 grandchildren.  Has a high school education.  Two-year degree in engineering.  Is retired from the power.  Smoked one pack per day for greater than 25 years, but quit in 2010.  Had a history of heavy alcoholism until 2005.  No history of illicit drug abuse.  FAMILY HISTORY:  Significant for coronary artery disease,  type 2 diabetes, premature MI, prostate cancer, congestive heart failure, and the need for pacemaker placement.  LABORATORY EVALUATION:  Sodium 127, potassium 5.9, chloride 97, BUN 39, creatinine 0.9, glucose 226, ionized calcium 1.05.  Hemoglobin 15.0. Initial set of cardiac enzymes negative.  BNP 559.  EKG shows paced rhythm.  Chest x-ray reveals cardiomegaly with diffuse interstitial and air space pattern suggestive of edema and failure, small bilateral pleural effusions, and small asymmetric density in the right middle lobe, question superimposed pneumonia.  Potassium 4.4.  PHYSICAL EXAMINATION:  GENERAL:  We have a gentleman sitting on the edge of the bed and rapid breathing, able to speak in partial sentences, alert and oriented x3. HEENT:  Sclerae anicteric.  Extraocular movements were intact.  There is no oropharyngeal lesions. NECK:  Supple.  There is no cervical lymphadenopathy. LUNGS:  Diffuse rales with no overt bronchospasm. CARDIOVASCULAR:  Distant heart sounds with a rapid rate. ABDOMEN:  Soft, nontender, and nondistended abdomen.  Bowel sounds are present. EXTREMITIES:  Trace to 1+ edema.  Pedal pulses are intact.  No peripheral cyanosis.  The patient can move all four extremities. NEUROLOGICAL:  Grossly nonfocal.  ASSESSMENT/PLAN: 1. Questionable pneumonia.  We will continue prescribed Rocephin and     azithromycin in the emergency room and support from a pulmonary     status, but again question whether this is the underlying issue     especially given the lack of fevers, chills, purulent sputum. 2. Congestive heart failure with history of nonischemic     cardiomyopathy, ejection fraction 25% and need of a possible     pacemaker replacement.  The patient to be admitted to step-down     unit for IV diuretics and IV nitroglycerin per Cardiology. 3. Type 2 diabetes, would follow on home medications, unclear how much     Lantus insulin the patient is taking at this  point, home regimen     has in excess of 100 units per day.  The patient states that he may     have been only taken 40 to 60 units per day.  We will start on 20     units subcu b.i.d. and continue sliding scale as ordered by     Cardiology. 4. Depression appears clinically compensated on current regimen.  We     will continue the same.  Thanks Cardiology for admission and we will continue to follow and manage other medical problems.     Larina Earthly, M.D.     RA/MEDQ  D:  05/26/2010  T:  05/27/2010  Job:  [id number]  cc:   Ritta Slot, MD Kari Baars, M.D.  Electronically Signed by Larina Earthly M.D. on 06/11/2010 10:12:39 PM

## 2010-06-14 NOTE — Consult Note (Signed)
NAME:  Gregory Horne, Gregory Horne NO.:  [id number]  MEDICAL RECORD NO.:  [id number]           PATIENT TYPE:  I  LOCATION:  2920                         FACILITY:  [facility]  PHYSICIAN:  Doylene Canning. Ladona Ridgel, MD    DATE OF BIRTH:  10-25-1940  DATE OF CONSULTATION:  05/27/2010 DATE OF DISCHARGE:                                CONSULTATION   The consultation was requested by Thereasa Solo. Little, MD  INDICATION FOR CONSULTATION:  Evaluation of class III to class IV congestive heart failure in the setting of pacing-induced left bundle- branch block for possible upgrade to a BiV ICD.  HISTORY OF PRESENT ILLNESS:  The patient is a 70 year old male with a longstanding nonischemic cardiomyopathy.  Over the last 6 months, his heart failure symptoms have worsened.  His EF is 25%.  The patient was hospitalized last month with a heart failure and was in his usual state of health until he was hospitalized several days ago with recurrent CHF. No obvious reason for his symptoms.  The patient has not had chest pain or anginal symptoms.  He does note peripheral edema.  He has not had syncope and denies significant palpitations.  The patient has not had any changes in his bowel or bladder habit.  His past medical history is notable for a history of depression in the past.  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  He does have very minimal coronary artery disease by catheterization.  He is status post pacemaker in 2006 secondary to heart block.  Prior to last month, his most recent heart failure exacerbation was in 2011.  His medications have included: 1. Furosemide. 2. Multiple bronchial dilators. 3. Metoprolol 25 twice a day. 4. Lisinopril 20 twice a day. 5. Potassium. 6. ACE inhibitors. 7. Diabetic regimen.  FAMILY HISTORY:  Negative for premature coronary artery disease.  The patient lives with his 81 year old mother.  SOCIAL HISTORY:  The patient denies tobacco use, having stopped smoking over 20  years ago.  He denies alcohol abuse.  REVIEW OF SYSTEMS:  Negative except as noted in the HPI.  All systems were reviewed.  PHYSICAL EXAMINATION:  GENERAL:  He is a pleasant, well-appearing middle- aged man, looks little bit older than stated age. VITAL SIGNS:  Blood pressure was 100/70, pulse was 100 and regular, respirations were 18, temperature is 98. HEENT:  Normocephalic and atraumatic.  Pupils equal and round. Oropharynx is moist.  Sclerae anicteric. NECK:  Revealed 8-cm jugular venous distention.  There is no thyromegaly.  Trachea was midline.  Carotids 2+ and symmetric. LUNGS:  Rales in the bases bilaterally, approximately 1/4th of the way up.  There was minimal increased work of breathing.  There are no wheezes or rhonchi noted. CARDIOVASCULAR:  Revealed regular rate and rhythm.  Normal S1 and S2. There is a soft summation gallop present. ABDOMEN:  Soft and nontender.  There is no organomegaly. EXTREMITIES:  Demonstrated no cyanosis, clubbing, or edema.  The pulses are 2+ and symmetric.  There is trace peripheral edema. SKIN:  Normal. NEUROLOGIC:  Alert and oriented x3 with the cranial nerves intact. Strength is 5/5 and symmetric.  The EKG demonstrates sinus rhythm with pacing-induced left bundle-branch block, QRS duration was 220 milliseconds.  IMPRESSION:  Congestive heart failure (acute on chronic systolic).  DISCUSSION:  The patient's symptoms are worsened by his pacing-induced left bundle-branch block.  I have discussed the treatment options with the patient.  He has longstanding LV dysfunction despite maximal medical therapy for his heart failure, and I have recommended that with his heart failure and his prolonged QRS duration with a left bundle-branch block pattern, that we proceed with BiV ICD upgrade.  The risks, benefits, goals, and expectations of the procedure have been discussed with the patient and he wishes to proceed.  Up titration of his medication, we  recommended following the procedure.     Doylene Canning. Ladona Ridgel, MD     GWT/MEDQ  D:  05/27/2010  T:  05/28/2010  Job:  [id number]  cc:   Gerlene Burdock A. Alanda Amass, M.D. Larina Earthly, M.D.  Electronically Signed by Lewayne Bunting MD on 06/14/2010 06:47:36 AM

## 2010-06-14 NOTE — Op Note (Signed)
NAME:  JACOLBY, RISBY NO.:  [id number]  MEDICAL RECORD NO.:  [id number]           PATIENT TYPE:  LOCATION:                                 FACILITY:  PHYSICIAN:  Doylene Canning. Ladona Ridgel, MD    DATE OF BIRTH:  Dec 23, 1940  DATE OF PROCEDURE:  05/29/2010 DATE OF DISCHARGE:                              OPERATIVE REPORT   PROCEDURE PERFORMED:  Removal of previously implanted dual-chamber pacemaker, insertion of a BiV ICD utilizing coronary sinus venography, defibrillation threshold testing, and revision of the previous pacemaker pocket to accommodate the new defibrillator with its LV lead.  INTRODUCTION:  The patient is a 70 year old man with class IV congestive heart failure requiring positive inotropes.  The patient was admitted to the hospital and treated with dobutamine and diuresis.  He is now referred for upgrade of the dual-chamber pacemaker for which he has complete heart block to a BiV ICD.  PROCEDURE:  After informed consent was obtained, the patient was taken to the diagnostic EP lab in the fasting state.  After the usual preparation and draping, intravenous fentanyl and midazolam was given for sedation.  Lidocaine 30 mL was infiltrated into the left infraclavicular region.  A 7-cm incision was carried out over this region.  Electrocautery was utilized to dissect down to the pacemaker pocket.  Care was made not to enter the pocket.  At this point, the left subclavian vein was punctured x2 and the St. Jude Durata 7121 65-cm active fixation defibrillation lead, serial number [id number], was advanced into the right ventricle and placed at the RV apical septum. The threshold was less than a volt at this location with the lead actively fixed and the R-waves were greater than 10 mV (paced).  A 10 V pacing did not stimulate the diaphragm, and with active fixation of the lead, there was a large injury current present.  At this point, attention was then turned towards  placement of the LV lead.  A coronary sinus guiding catheter was advanced over an 0.035 guidewire into the right atrium.  The coronary sinus was cannulated with a moderate amount of difficulty.  Venography of the coronary sinus was carried out.  This demonstrated a satisfactory lateral vein that came off the coronary sinus at about 3 o'clock in the LAO projection.  The angioplasty 0.014 guidewire (Mailman) was advanced into the lateral vein and the St. Jude Quartet 86-cm quadripolar LV lead was advanced over the guidewire into the lateral vein.  The lead serial number was [id number].  Once the lead was in satisfactory position, the guiding catheter was removed in the usual manner.  At this point, the leads were secured to the subpectoralis fascia with a figure-of-eight silk suture and the sewing sleeves were secured with silk suture.  At this point, electrocautery was utilized to enter the pacemaker pocket and electrocautery was utilized to free up the very dense fibrous adhesions in the pocket.  The pocket was irrigated and electrocautery was utilized to assure hemostasis.  At this point, the pocket was enlarged and revised to accommodate the larger defibrillator.  The new leads were connected to the  old pacemaker and new pacing and defibrillator leads and placed back in the subcutaneous pocket.  The pocket was irrigated with additional antibiotic irrigation and the patient was more deeply sedated for defibrillation threshold testing.  It should be noted that the patient did require IV fluid bolus to maintain his blood pressure.  Versed and fentanyl and predominantly Versed were given, and after the patient was satisfactorily sedated VF was induced with a T-wave shock.  A 20-joule shock was subsequent delivered which failed to terminate VF.  A 30-joule shock was then delivered terminating VF and restoring sinus rhythm.  At this point, no additional defibrillation threshold testing was  carried out and the incision was closed with 2-0 and 3-0 Vicryl.  Benzoin and Steri-Strips were painted on the skin.  A pressure dressing was applied, and the patient was returned to his room in satisfactory condition.  COMPLICATIONS:  There were no immediate procedure complications.  RESULTS:  This demonstrated successful removal of previously implanted dual-chamber pacemaker followed by successful insertion of a new LV pacing lead and a new defibrillator.  The old right atrial lead was used and the old RV lead was capped.  Defibrillation threshold testing was carried out demonstrating a satisfactory defibrillation threshold testing.     Doylene Canning. Ladona Ridgel, MD     GWT/MEDQ  D:  05/29/2010  T:  05/30/2010  Job:  [id number]  cc:   Thereasa Solo. Little, M.D.  Electronically Signed by Lewayne Bunting MD on 06/14/2010 06:47:44 AM

## 2010-06-23 ENCOUNTER — Emergency Department (HOSPITAL_COMMUNITY): Payer: Medicare Other

## 2010-06-23 ENCOUNTER — Observation Stay (HOSPITAL_COMMUNITY)
Admission: EM | Admit: 2010-06-23 | Discharge: 2010-06-25 | Disposition: A | Discharge status: Home Health | Payer: Medicare Other | Attending: Cardiology | Admitting: Cardiology

## 2010-06-23 DIAGNOSIS — Z7982 Long term (current) use of aspirin: Secondary | ICD-10-CM | POA: Insufficient documentation

## 2010-06-23 DIAGNOSIS — I509 Heart failure, unspecified: Secondary | ICD-10-CM | POA: Insufficient documentation

## 2010-06-23 DIAGNOSIS — F319 Bipolar disorder, unspecified: Secondary | ICD-10-CM | POA: Insufficient documentation

## 2010-06-23 DIAGNOSIS — Z950 Presence of cardiac pacemaker: Secondary | ICD-10-CM | POA: Insufficient documentation

## 2010-06-23 DIAGNOSIS — E119 Type 2 diabetes mellitus without complications: Secondary | ICD-10-CM | POA: Insufficient documentation

## 2010-06-23 DIAGNOSIS — J4489 Other specified chronic obstructive pulmonary disease: Secondary | ICD-10-CM | POA: Insufficient documentation

## 2010-06-23 DIAGNOSIS — F17200 Nicotine dependence, unspecified, uncomplicated: Secondary | ICD-10-CM | POA: Insufficient documentation

## 2010-06-23 DIAGNOSIS — I10 Essential (primary) hypertension: Secondary | ICD-10-CM | POA: Insufficient documentation

## 2010-06-23 DIAGNOSIS — Z79899 Other long term (current) drug therapy: Secondary | ICD-10-CM | POA: Insufficient documentation

## 2010-06-23 DIAGNOSIS — I2510 Atherosclerotic heart disease of native coronary artery without angina pectoris: Secondary | ICD-10-CM | POA: Insufficient documentation

## 2010-06-23 DIAGNOSIS — I951 Orthostatic hypotension: Principal | ICD-10-CM | POA: Insufficient documentation

## 2010-06-23 DIAGNOSIS — Z794 Long term (current) use of insulin: Secondary | ICD-10-CM | POA: Insufficient documentation

## 2010-06-23 DIAGNOSIS — I428 Other cardiomyopathies: Secondary | ICD-10-CM | POA: Insufficient documentation

## 2010-06-23 DIAGNOSIS — J449 Chronic obstructive pulmonary disease, unspecified: Secondary | ICD-10-CM | POA: Insufficient documentation

## 2010-06-23 LAB — URINALYSIS, ROUTINE W REFLEX MICROSCOPIC
Bilirubin Urine: NEGATIVE
Glucose, UA: 500 mg/dL — AB
Hgb urine dipstick: NEGATIVE
Ketones, ur: NEGATIVE mg/dL
Nitrite: NEGATIVE
Protein, ur: NEGATIVE mg/dL
Specific Gravity, Urine: 1.024 (ref 1.005–1.030)
Urobilinogen, UA: 0.2 mg/dL (ref 0.0–1.0)
pH: 5 (ref 5.0–8.0)

## 2010-06-23 LAB — CBC
MCH: 29.4 pg (ref 26.0–34.0)
Platelets: 107 10*3/uL — ABNORMAL LOW (ref 150–400)
RBC: 4.87 MIL/uL (ref 4.22–5.81)
RDW: 14.7 % (ref 11.5–15.5)

## 2010-06-23 LAB — COMPREHENSIVE METABOLIC PANEL
ALT: 17 U/L (ref 0–53)
Albumin: 3.7 g/dL (ref 3.5–5.2)
Alkaline Phosphatase: 69 U/L (ref 39–117)
Calcium: 9.1 mg/dL (ref 8.4–10.5)
GFR calc Af Amer: >60 mL/min (ref >60)
Potassium: 4.5 mEq/L (ref 3.5–5.1)
Sodium: 136 mEq/L (ref 135–145)
Total Protein: 6.8 g/dL (ref 6.0–8.3)

## 2010-06-23 LAB — GLUCOSE, CAPILLARY
Glucose-Capillary: 328 mg/dL — ABNORMAL HIGH (ref 70–99)
Glucose-Capillary: 146 mg/dL — ABNORMAL HIGH (ref 70–99)

## 2010-06-23 LAB — BASIC METABOLIC PANEL
Calcium: 8.7 mg/dL (ref 8.4–10.5)
Chloride: 97 mEq/L (ref 96–112)
Creatinine, Ser: 1.74 mg/dL — ABNORMAL HIGH (ref 0.4–1.5)
GFR calc Af Amer: 47 mL/min — ABNORMAL LOW (ref >60)
GFR calc non Af Amer: 39 mL/min — ABNORMAL LOW (ref >60)

## 2010-06-23 LAB — DIFFERENTIAL
Basophils Relative: 0 % (ref 0–1)
Eosinophils Absolute: 0.3 10*3/uL (ref 0.0–0.7)
Monocytes Relative: 7 % (ref 3–12)
Neutrophils Relative %: 72 % (ref 43–77)

## 2010-06-23 LAB — DIGOXIN LEVEL: Digoxin Level: <0.2 ng/mL — ABNORMAL LOW (ref 0.8–2.0)

## 2010-06-23 LAB — TROPONIN I: Troponin I: 0.09 ng/mL — ABNORMAL HIGH (ref 0.00–0.06)

## 2010-06-24 ENCOUNTER — Observation Stay (HOSPITAL_COMMUNITY): Payer: Medicare Other

## 2010-06-24 LAB — CBC
HCT: 36 % — ABNORMAL LOW (ref 39.0–52.0)
HCT: 36.3 % — ABNORMAL LOW (ref 39.0–52.0)
HCT: 39.1 % (ref 39.0–52.0)
HCT: 44.8 % (ref 39.0–52.0)
Hemoglobin: 12 g/dL — ABNORMAL LOW (ref 13.0–17.0)
Hemoglobin: 12.3 g/dL — ABNORMAL LOW (ref 13.0–17.0)
Hemoglobin: 13.1 g/dL (ref 13.0–17.0)
Hemoglobin: 13.2 g/dL (ref 13.0–17.0)
Hemoglobin: 14.9 g/dL (ref 13.0–17.0)
MCHC: 33.3 g/dL (ref 30.0–36.0)
MCV: 93.7 fL (ref 78.0–100.0)
MCV: 94.7 fL (ref 78.0–100.0)
RBC: 4.17 MIL/uL — ABNORMAL LOW (ref 4.22–5.81)
RDW: 12.8 % (ref 11.5–15.5)
RDW: 12.8 % (ref 11.5–15.5)
RDW: 13.1 % (ref 11.5–15.5)
WBC: 12.2 10*3/uL — ABNORMAL HIGH (ref 4.0–10.5)
WBC: 12.9 10*3/uL — ABNORMAL HIGH (ref 4.0–10.5)

## 2010-06-24 LAB — COMPREHENSIVE METABOLIC PANEL
ALT: 28 U/L (ref 0–53)
ALT: 31 U/L (ref 0–53)
AST: 23 U/L (ref 0–37)
AST: 25 U/L (ref 0–37)
Albumin: 2.8 g/dL — ABNORMAL LOW (ref 3.5–5.2)
Alkaline Phosphatase: 39 U/L (ref 39–117)
Alkaline Phosphatase: 42 U/L (ref 39–117)
Alkaline Phosphatase: 43 U/L (ref 39–117)
Alkaline Phosphatase: 44 U/L (ref 39–117)
BUN: 14 mg/dL (ref 6–23)
BUN: 8 mg/dL (ref 6–23)
BUN: 9 mg/dL (ref 6–23)
CO2: 24 mEq/L (ref 19–32)
CO2: 26 mEq/L (ref 19–32)
CO2: 28 mEq/L (ref 19–32)
Chloride: 101 mEq/L (ref 96–112)
Chloride: 106 mEq/L (ref 96–112)
Chloride: 98 mEq/L (ref 96–112)
GFR calc Af Amer: >60 mL/min (ref >60)
GFR calc Af Amer: >60 mL/min (ref >60)
GFR calc non Af Amer: >60 mL/min (ref >60)
GFR calc non Af Amer: >60 mL/min (ref >60)
Glucose, Bld: 153 mg/dL — ABNORMAL HIGH (ref 70–99)
Glucose, Bld: 175 mg/dL — ABNORMAL HIGH (ref 70–99)
Glucose, Bld: 221 mg/dL — ABNORMAL HIGH (ref 70–99)
Potassium: 3.3 mEq/L — ABNORMAL LOW (ref 3.5–5.1)
Potassium: 3.4 mEq/L — ABNORMAL LOW (ref 3.5–5.1)
Potassium: 3.8 mEq/L (ref 3.5–5.1)
Potassium: 4.1 mEq/L (ref 3.5–5.1)
Sodium: 132 mEq/L — ABNORMAL LOW (ref 135–145)
Sodium: 134 mEq/L — ABNORMAL LOW (ref 135–145)
Sodium: 135 mEq/L (ref 135–145)
Total Bilirubin: 0.7 mg/dL (ref 0.3–1.2)
Total Bilirubin: 1 mg/dL (ref 0.3–1.2)
Total Bilirubin: 1.3 mg/dL — ABNORMAL HIGH (ref 0.3–1.2)
Total Protein: 5.7 g/dL — ABNORMAL LOW (ref 6.0–8.3)

## 2010-06-24 LAB — PROTIME-INR
INR: 1 (ref 0.00–1.49)
INR: 1.2 (ref 0.00–1.49)
Prothrombin Time: 14.7 seconds (ref 11.6–15.2)

## 2010-06-24 LAB — GLUCOSE, CAPILLARY
Glucose-Capillary: 100 mg/dL — ABNORMAL HIGH (ref 70–99)
Glucose-Capillary: 188 mg/dL — ABNORMAL HIGH (ref 70–99)
Glucose-Capillary: 127 mg/dL — ABNORMAL HIGH (ref 70–99)
Glucose-Capillary: 151 mg/dL — ABNORMAL HIGH (ref 70–99)
Glucose-Capillary: 165 mg/dL — ABNORMAL HIGH (ref 70–99)
Glucose-Capillary: 168 mg/dL — ABNORMAL HIGH (ref 70–99)
Glucose-Capillary: 182 mg/dL — ABNORMAL HIGH (ref 70–99)
Glucose-Capillary: 183 mg/dL — ABNORMAL HIGH (ref 70–99)
Glucose-Capillary: 186 mg/dL — ABNORMAL HIGH (ref 70–99)

## 2010-06-24 LAB — POCT CARDIAC MARKERS
Myoglobin, poc: 155 ng/mL (ref 12–200)
Myoglobin, poc: 186 ng/mL (ref 12–200)
Troponin i, poc: <0.05 ng/mL (ref 0.00–0.09)

## 2010-06-24 LAB — POCT I-STAT, CHEM 8
Calcium, Ion: 1.13 mmol/L (ref 1.12–1.32)
Creatinine, Ser: 1 mg/dL (ref 0.4–1.5)
Hemoglobin: 16 g/dL (ref 13.0–17.0)
Sodium: 135 mEq/L (ref 135–145)
TCO2: 26 mmol/L (ref 0–100)

## 2010-06-24 LAB — SALICYLATE LEVEL: Salicylate Lvl: <4 mg/dL (ref 2.8–20.0)

## 2010-06-24 LAB — DIFFERENTIAL
Basophils Absolute: 0 10*3/uL (ref 0.0–0.1)
Basophils Absolute: 0 10*3/uL (ref 0.0–0.1)
Basophils Relative: 0 % (ref 0–1)
Basophils Relative: 0 % (ref 0–1)
Eosinophils Relative: 1 % (ref 0–5)
Eosinophils Relative: 1 % (ref 0–5)
Lymphocytes Relative: 6 % — ABNORMAL LOW (ref 12–46)
Lymphs Abs: 0.7 10*3/uL (ref 0.7–4.0)
Monocytes Absolute: 0.6 10*3/uL (ref 0.1–1.0)
Monocytes Absolute: 0.8 10*3/uL (ref 0.1–1.0)
Monocytes Absolute: 0.8 10*3/uL (ref 0.1–1.0)
Neutro Abs: 10.5 10*3/uL — ABNORMAL HIGH (ref 1.7–7.7)
Neutrophils Relative %: 86 % — ABNORMAL HIGH (ref 43–77)

## 2010-06-24 LAB — BASIC METABOLIC PANEL
BUN: 15 mg/dL (ref 6–23)
CO2: 25 mEq/L (ref 19–32)
Calcium: 8.8 mg/dL (ref 8.4–10.5)
Calcium: 7.8 mg/dL — ABNORMAL LOW (ref 8.4–10.5)
Chloride: 105 mEq/L (ref 96–112)
Chloride: 106 mEq/L (ref 96–112)
Creatinine, Ser: 0.68 mg/dL (ref 0.4–1.5)
Creatinine, Ser: 0.77 mg/dL (ref 0.4–1.5)
GFR calc Af Amer: >60 mL/min (ref >60)
GFR calc Af Amer: >60 mL/min (ref >60)
GFR calc non Af Amer: >60 mL/min (ref >60)

## 2010-06-24 LAB — HEPATIC FUNCTION PANEL
Alkaline Phosphatase: 52 U/L (ref 39–117)
Bilirubin, Direct: 0.3 mg/dL (ref 0.0–0.3)
Total Bilirubin: 0.7 mg/dL (ref 0.3–1.2)

## 2010-06-24 LAB — PHOSPHORUS: Phosphorus: 3.7 mg/dL (ref 2.3–4.6)

## 2010-06-24 LAB — HEMOGLOBIN A1C
Hgb A1c MFr Bld: 8.6 % — ABNORMAL HIGH (ref 4.6–6.1)
Mean Plasma Glucose: 200 mg/dL

## 2010-06-25 LAB — GLUCOSE, CAPILLARY
Glucose-Capillary: 182 mg/dL — ABNORMAL HIGH (ref 70–99)
Glucose-Capillary: 272 mg/dL — ABNORMAL HIGH (ref 70–99)
Glucose-Capillary: 227 mg/dL — ABNORMAL HIGH (ref 70–99)

## 2010-06-28 ENCOUNTER — Ambulatory Visit: Payer: Self-pay | Admitting: Internal Medicine

## 2010-07-01 NOTE — Discharge Summary (Signed)
NAME:  Gregory Horne, Gregory Horne NO.:  [id number]  MEDICAL RECORD NO.:  [id number]           PATIENT TYPE:  O  LOCATION:  2019                         FACILITY:  [facility]  PHYSICIAN:  Ritta Slot, MD     DATE OF BIRTH:  02-22-1941  DATE OF ADMISSION:  06/23/2010 DATE OF DISCHARGE:  06/25/2010                              DISCHARGE SUMMARY   DISCHARGE DIAGNOSES: 1. Syncope/collapse. 2. Orthostatic hypotension. 3. Nonischemic cardiomyopathy, ejection fraction 25%. 4. Status post biventricular ICD upgrade May 20, 2010, with St. Jude     device. 5. Chronic obstructive pulmonary disease, on home oxygen.  Used     primarily during ambulation. 6. History of bipolar. 7. History of hypertension on last admission. 8. Diabetes mellitus type 2. 9. Hyperglycemia.  HOSPITAL COURSE:  Gregory Horne is a 70 year old Caucasian male with a history of congestive heart failure, nonischemic cardiomyopathy with an ejection fraction of 25% with a BiV ICD upgrade on May 29, 2010, with St. Jude, diabetes mellitus insulin dependent, chronic left bundle- branch block primarily induced by right ventricular pacing, bipolar disorder, bladder cancer.  He had normal coronary arteries by the cath in October 2011 and mildly elevated filling pressures and severe global hypokinesis.  The patient states that he has not been feeling quite himself since previous DC.  He had 3 episodes of falling out shortly after standing, starting to walk.  He is given IV fluids in the emergency room with positive orthostatic changes.  He is admitted for observation.  His Lasix dose had been recently decreased from 80 mg b.i.d. to 20 mg daily.  Also his blood glucose level was were greater than 300, they are contributed to over diuresis.  He had improved symptoms with intravenous IV fluids, his ACE inhibitor was discontinued. He has continued without complaints during admission.  He has no further symptoms of  orthostatic hypotension, he was seen by Dr. Lynnea Ferrier, feels stable for discharge.  DISCHARGE LABS:  WBC is 8.6, hemoglobin 14.3, hematocrit 42.4, platelets 107.  Sodium 140, potassium 4.0, chloride 105, carbon dioxide 26, glucose 79, BUN 18, creatinine 0.68, calcium 8.8.  Troponin on admission was 0.09.  BNP was 38.  Thyroid stimulating hormone was 0.572.  Digoxin level was less than 0.2.  Urinalysis was positive for glucose at 500.  STUDIES/PROCEDURES:  Chest x-ray on June 24, 2010, no evidence of acute pulmonary disease.  Cardiac enlargement noted.  DISCHARGE MEDICATIONS: 1. Albuterol inhaler 2 puffs inhaled q.6 h. as needed. 2. Acetaminophen 325 mg 2 tablets by mouth every 4 hours as needed. 3. Losartan 25 mg 1 tablet by mouth daily. 4. Aspirin 325 mg 1 tablet by mouth daily. 5. Atrovent 2 puffs inhaled daily as needed for shortness of breath. 6. Centrum Silver over-the-counter 1 tablet by mouth daily. 7. Digoxin 0.25 mg 1 tab by mouth daily. 8. Furosemide 20 mg 1 tablet by mouth daily. 9. Lantus 34 units subcutaneously twice daily. 10.Metoprolol tartrate 25 mg tablets one-half tablet by mouth twice     daily. 11.Novolin insulin 5 units subcutaneous daily. 12.NovoLog should be administered per color checks 3 times daily. 13.Potassium  chloride 20 mEq one-half tablet by mouth daily. 14.Prilosec 20 mg 1 capsule by mouth daily as needed. 15.Risperdal 2 mg 1 tablet by mouth daily at bedtime. 16.Senna/docusate 1 tablet by mouth twice daily. 17.Sertraline 100 mg 2 tablets by mouth daily.  DISPOSITION:  Gregory Horne is discharged home in stable condition. Increase his activity slowly.  He is recommended to eat a low-sodium heart-healthy diet.  He will follow up with Dr. Lynnea Ferrier or Dr. Royann Shivers in approximately 1 month's time.  His orthostatic blood pressure check as of June 25, 2010, lying down blood pressure was 161/78, sitting 158/86, and standing 148/74 with pulse of 63, 71, and  71 respectively.    ______________________________ Wilburt Finlay, PA   ______________________________ Ritta Slot, MD    BH/MEDQ  D:  06/25/2010  T:  06/26/2010  Job:  [id number]  cc:   Dr. Clelia Croft  Electronically Signed by Wilburt Finlay PA on 07/01/2010 10:52:45 AM Electronically Signed by Ritta Slot MD on 07/01/2010 04:07:16 PM
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
## 2010-07-23 NOTE — H&P (Signed)
NAME:  Gregory Horne, Gregory Horne NO.:  [id number]   MEDICAL RECORD NO.:  [id number]          PATIENT TYPE:  INP   LOCATION:  2106                         FACILITY:  [facility]   PHYSICIAN:  Kari Baars, M.D.  DATE OF BIRTH:  [birth year]   DATE OF ADMISSION:  03/30/2008  DATE OF DISCHARGE:                              HISTORY & PHYSICAL   CHIEF COMPLAINT:  Overdose.   HISTORY OF PRESENT ILLNESS:  Gregory Horne is a 70 year old white male  with a history of bipolar disorder with a history of recurrent suicide  attempts, congestive heart failure with an ejection fraction of 20-25%,  and severe COPD, who presented to the emergency department today with  intentional overdose.  The patient and his wife report increased  depression symptoms over the past month which he attributes to increase  financial and social stressors.  This has been accompanied by  significant weight loss of 15-20 pounds and increase in anxiety.  He was  actually seen in my office on March 23, 2008, for his annual physical  and did report some increased depression at that time.  He denied  suicidal ideation or homicidal ideation at that time and stated that he  was seeing a Veterinary surgeon at the [place].  He did not report his prior history  of suicide attempt and that was unknown to me at that time.  I increased  his Zoloft from 100 mg to 200 mg and recommended starting Wellbutrin XL  150 mg daily.  He did increase his Zoloft, but did not start the  Wellbutrin.  His wife states that she thought he was doing a little bit  better yesterday morning, but this morning after she left at around 10  a.m., he took multiple pills.  He states that he took about 47 Coreg  which are 25 mg each, 10 Zoloft 100 mg pills, 1 Lodine, 2 Lyrica, and 10  of his wife's Wellbutrin XL 150 mg pills.  When she returned home after  lunch, she asked him to help her with groceries and when he stood up he  fell and she caught him before he hit the  ground.  He was minimally  responsive at that time, but has been awake with some minimal confusion  in the emergency department.  In the emergency department, his blood  pressure was initially 68/48 with a heart rate of 60 which has been  paced.  He was given some IV fluid hydration and placement of glucagon  drip which is currently at 1 mg an hour.  His blood pressure had been  relatively stable throughout the afternoon, but has now dropped into the  60s again.  He states that he was overwhelmed and did take these  medications with a suicidal intent.  He has had a history of bipolar  with manic episodes characterized by extensive spinning and severe  depression.  He has not drunk any alcohol in the past month, but has a  history of heavy alcohol.   PAST MEDICAL HISTORY:  1. Type 2 diabetes.  2. Severe COPD, PFTs in November 2008.  3. Hyperlipidemia.  4. Congestive heart failure, nonischemic cardiomyopathy with an      ejection fraction of 20-25% .  5. Depression/bipolar disorder with a history of suicide attempt in      October 2007, at which time, he was hospitalized in Behavioral      Health followed by [facility].  He was also      hospitalized at [place], [place] in 2006 for suicide attempt and mania      episode.  6. Elevated liver function tests.  7. Bladder cancer, status post resection.  8. Colon polyps.  9. 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  10.Status post cholecystectomy.  11.Status post left eyebrow lift.   CURRENT MEDICATIONS:  1. Coreg 25 mg 1/2 b.i.d.  2. Digoxin 0.125 mg daily.  3. Glipizide 10 mg b.i.d.  4. Zoloft 100 mg daily.  5. Furosemide 20 mg daily.  6. Omeprazole 20 mg daily.  7. Aspirin 81 mg daily.  8. Lisinopril 20 mg 1/2 daily.  9. Crestor 20 mg daily.  10.Symbicort 160/4.5 b.i.d.  11.Januvia 100 mg daily which he has not been taking recently.  12.Wellbutrin XL 150  mg daily which he has not started.   ALLERGIES:  CODEINE.   SOCIAL HISTORY:  Married, with 3 children and 8 grandchildren.  He is a  retired Investment banker, corporate.  He smoked 1 pack per day  for more than 25 years.  History of heavy alcohol use/alcoholism until  2005.  He has had no alcohol for the past month.   FAMILY HISTORY:  Father died of an MI at 66.  Mother had an MI in her  16s, but is still alive in her 67s.  Sister with congestive heart  failure and son with coronary artery disease.   PHYSICAL EXAMINATION:  VITAL SIGNS:  Blood pressure initially 68/48,  subsequently up to 93/61, pulse 60 and paced, respirations 16,  temperature 96.5, and oxygen saturation 95% on room air.  GENERAL:  He is pale appearing with glassy eyes.  He is awake and  oriented to person, year, and place.  Some inappropriate comments and  perseveration over wanting a bath.  HEENT:  Injected sclerae bilaterally.  Oropharynx is moist.  NECK:  Supple without lymphadenopathy, JVD, or carotid bruits.  HEART:  Regular rate and rhythm without murmurs, rubs, or gallops.  LUNGS:  Clear to auscultation bilaterally.  No apparent crackles.  ABDOMEN:  Soft, nondistended, and nontender with normoactive bowel  sounds.  EXTREMITIES:  No clubbing, cyanosis, or edema.  NEUROLOGIC:  He does have tremor bilaterally, but nonfocal exam.   LABORATORY STUDIES:  CBC shows a white count of 9.8, hemoglobin 14.9,  and platelets 148.  BMET significant for sodium 135, potassium 3.9,  chloride 99, bicarb 26, BUN 14, creatinine 1.0, and glucose 178.  Aspirin level less than 4.  Acetaminophen level less than 10.  Digoxin  level 0.4.  Cardiac enzymes negative.  Liver function tests are normal.   STUDIES:  EKG shows a paced rhythm with left bundle-branch block and  rate of 60.   ASSESSMENT/PLAN:  1. Intentional polysubstance overdose - overdose consisting primarily      of beta-blocker and selective serotonin reuptake  inhibitor with      several other lower dose substances.  We will admit to the      intensive care unit for stabilization with intravenous fluid  hydration, glucagon drip as an antidote for the beta-blocker, and      possible pressor therapy.  I discussed the case with Poison Control      [phone]) who recommends increasing his glucagon drip up to 4-      5 mg an hour to maintain his blood pressure and urine output.  We      will titrate this down to maintain a mean arterial pressure of 60.      We will hydrate as tolerated by his congestive heart failure,      though he may develop some issues with volume with this.  May      ultimately need pressor support, so I will consult Critical Care      Medicine.  Given the risk of arrhythmia with selective serotonin      reuptake inhibitor, we will ask Cardiology to see him given his      long-standing relationship with Dr. Clarene Duke.  The pacemaker      fortunately limit life-threatening bradycardia.  We will consult      Psychiatry/ACT Team for inpatient psychiatry following medical      stabilization.  2. Severe depression/bipolar disorder - we will hold antidepressants      for now with ongoing therapy per Psychiatry.  He will need to      establish with more consistent outpatient Psychiatry in the future.  3. Chronic obstructive pulmonary disease - continue Symbicort b.i.d.  4. Type 2 diabetes - I anticipate that he will have some hypertrophy      or glycemia with the glucagon drip.  We will use a Glucometer      protocol while in the intensive care unit and then transition back      to his p.o. regimen once tolerating p.o.'s.  5. Congestive heart failure - we will hold medications until his blood      pressure recovers.  Digoxin level is okay.  6. Gastrointestinal prophylaxis with proton pump inhibitor.  7. Deep vein thrombosis prophylaxis with heparin subcutaneously.  8. Disposition - anticipate discharge in 2-3 days if he is  medically      stable, but will need inpatient Psychiatry following discharge.       Kari Baars, M.D.  Electronically Signed     WS/MEDQ  D:  03/30/2008  T:  03/31/2008  Job:  [id number]   cc:   Thereasa Solo. Little, M.D.  [facility] [place]

## 2010-07-23 NOTE — Consult Note (Signed)
NAME:  Gregory Horne, ACCOMANDO NO.:  [id number]   MEDICAL RECORD NO.:  [id number]          PATIENT TYPE:  INP   LOCATION:  3739                         FACILITY:  [facility]   PHYSICIAN:  Jasmine Pang, M.D. DATE OF BIRTH:  10-Jan-1941   DATE OF CONSULTATION:  04/01/2008  DATE OF DISCHARGE:                                 CONSULTATION   REFERRING PHYSICIAN:  Antonietta Breach, M.D.   REASON FOR CONSULTATION:  Severe depression and suicide attempt.  Patient is not wanting to sign himself in to behavioral health as Dr.  Jeanie Sewer had recommended.  I was asked to determine whether he needed  to be committed or could go home.   IDENTIFICATION:  Mr. Rivenburg is a 70 year old male admitted to [facility] on March 31, 2008 after a polysubstance overdose.   HISTORY OF PRESENT ILLNESS:  According to Dr. Providence Crosby note, Mr.  Mitrano acknowledges that this was a suicide attempt.  He has had at  least 4 weeks of progressive depressed mood, poor energy, difficulty  concentrating, anhedonia with progressive suicidal thoughts.  He has had  much vegetation, staying in bed a lot.  He denies having consumed  alcohol for 2 months.  He has also been undergoing some stress.  He has  chronic stress of having a heart condition, also he and his wife are not  getting along and are planning to separate.  He is not planning to  return home to her but wants to go live with his son.  It is unclear  whether his son is agreeable with this.  He states he has a credit card  bill that is of concern to him.  He admits he is not taking his Zoloft,  however he also states when he was taking it he had not noted any help.   PAST PSYCHIATRIC HISTORY:  Mr. Terhune attempted suicide another time in  the past 2 years and that involved an overdose as well with intent to  kill himself.  He was started on Zoloft for antidepression but has  stopped this.  He did not feel it was helping.  He was admitted to [facility] in 2007.  He was diagnosed with bipolar I disorder and  discharged on Zoloft 50 mg daily and risperidone 1 mg q.h.s.  He does  have a long-term history of alcohol abuse.  At one point his son does  report that he would become intoxicated daily for long periods.  He has  been to [facility] twice.  The patient states he has been sober for  2 months most recently.  Mr. Boomer states that his youngest son has  obsessive-compulsive disorder and is treated with Zoloft.   MENTAL STATUS EXAM:  Mr. Ouzts was alert and oriented x4.  He had good  eye contact.  His speech was soft and slow.  There was some psychomotor  retardation noted.  His attention span was normal.  His concentration  was mildly decreased.  His affect is very constricted and mood  depressed.  Thought processes were logical and goal directed.  There  were no loose associations.  Thought content:  He acknowledges that his  action was a suicidal intent and his symptoms have not changed.  He has  thoughts of hopelessness and helplessness.  He has no hallucinations or  delusions.  His insight is intact but he has severe depression.   ASSESSMENT:  Axis I:  A.  Mood disorder, not otherwise specified, depressed phase.  B.  Rule out bipolar disorder, not otherwise specified, depressed phase.  C.  Alcohol dependence.  D.  Possible posttraumatic stress disorder as per Dr. Providence Crosby note  from his tour in [place].  Axis II:  Deferred.  Axis III:  See medical history in the chart.  Axis IV:  A.  General medical.  1. Marital.  2. Financial.  Axis V:  GAF was 35.   It was felt Mr. Borquez is still at risk for suicide.  Dr. Jeanie Sewer is  in agreement with this determination.   RECOMMENDATIONS:  At this point I agree with Dr. Jeanie Sewer that Mr.  Capitano should have an inpatient hospitalization at [facility].  There are too many risk factors for another  suicide attempt for him to  go to outpatient treatment.  We talked about  this disposition and he had ambivalent feelings about it.  I know I  need to but I cannot afford it.  He has no solid plans for followup as  an outpatient and no psychiatrist at the [place].  He has no good plans for  where he is going to live when he leaves.  He is still as depressed as  he was when he took the overdose, which puts him at additional risk.  I  talked with Mr. Gastelum about commitment to [facility] if he refuses to sign in  voluntarily.  He knows these are his choices.  He indicated he knew he  needed to be in the hospital and would probably be willing to sign  himself in, though there was still ambivalence about it as we talked.  He continued to identify reasons why he could not go to behavioral  health, including stating he and his sons are going to go to joint  depression therapy.   Thank you for this consultation with this very nice man.  If you have  any questions or concerns, please feel free to contact me.      Jasmine Pang, M.D.  Electronically Signed     BHS/MEDQ  D:  04/01/2008  T:  04/01/2008  Job:  [id number]

## 2010-07-23 NOTE — H&P (Signed)
NAME:  Gregory Horne, GUYMON NO.:  [id number]   MEDICAL RECORD NO.:  [id number]          PATIENT TYPE:  IPS   LOCATION:  0302                          FACILITY:  [facility]   PHYSICIAN:  Jasmine Pang, M.D. DATE OF BIRTH:  1940-11-27   DATE OF ADMISSION:  04/03/2008  DATE OF DISCHARGE:                       PSYCHIATRIC ADMISSION ASSESSMENT   PATIENT IDENTIFICATION:  This is a 70 year old male voluntarily admitted  on April 03, 2008.   HISTORY OF PRESENT ILLNESS:  The patient reports intentional overdose on  multiple medications, his own medications, taking approximately 40 Coreg  tablets, ten 100 mg Zoloft and ten 150 mg Wellbutrin.  Overdosed at  home.  Wife had found him.  The patient was taken to the hospital.  The  patient was medically stable and transferred here for further assessment  of his depression and suicide attempt.  He states today that he is not  sure why he did it, very impulsive.  He does report significant  stressors, stating that he is experiencing a multitude of things,  separation from his wife, having significant financial issues. He has a  history of alcohol use but states he has not done any drinking for the  past 2 months.  The patient is endorsing racing thoughts at bedtime,  having difficulty sleeping, sleeping approximately 3-4 hours at the  time.  Has had a loss of appetite with some associated weight loss as  well.  He also endorses having mood swings.   PAST PSYCHIATRIC HISTORY:  The patient was here before in October 2007  for an overdose at that time.  He has no current mental health  providers, but feels today that he probably has had mental illness  problems since childhood.   SOCIAL HISTORY:  This is a 70 year old male who is currently separated.  He lives in [place].  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   FAMILY HISTORIES:  Denies.   ALCOHOL AND DRUG HABITS:   The patient states that has a normal long  history of drinking, but stopped drinking about 2 months ago.  Felt like  he needed to.  Denies any withdrawal symptoms after he stopped.  Denies  any other recreational drug use.  Primary care Jania Steinke is the [place].   MEDICAL PROBLEMS:  1. History of chronic bronchitis.  2. Congestive heart failure.  3. Type 2 diabetes.  4. Pacemaker.  5. Hyperlipidemia.  6. History of bladder cancer status post resection.   MEDICATIONS:  1. Coreg 25 mg 1/2 tablet b.i.d.  2. Digoxin 0.125 mg daily.  3. Glipizide 10 mg b.i.d.  4. Furosemide 20 mg daily.  5. Omeprazole 20 mg daily.  6. Aspirin 81 mg daily.  7. Lisinopril 20 mg 1/2 tablet daily.  8. Crestor 20 mg daily.  9. Symbicort 160/4.5 two puffs b.i.d.  10.Januvia 100 mg daily.  11.Avelox 400 mg daily for the next 7 days.   DRUG ALLERGIES:  No known allergies.   PHYSICAL EXAMINATION:  GENERAL:  This is a middle-aged male currently  wearing oxygen.  Skin color is good.  He denies any complaints at this  time.  He does appear somewhat thin, but otherwise is in no acute  distress.  Again, he was fully assessed at [facility] emergency  department.  Initial history and physical notes that the patient was  somewhat glassy, he was hypertensive, brought to the emergency room.  VITAL SIGNS:  His temperature is 96.7, 87 heart rate, 24 respirations,  blood pressure is 109/77, 190 pounds, 6 feet tall.   LABORATORY DATA:  Shows an INR one, potassium of 3.3, WBC count is 12.2,  platelet count at 120, hemoglobin A1c was 8.6, glucose of 175.  His  salicylate level less than 4, acetaminophen level less than 10.   MENTAL STATUS EXAM:  The patient is in his room sitting in chair, again  using portable O2, cooperative, polite with good eye contact,  appropriately dressed.  Speech is soft-spoken, normal pace.  The  patient's mood is hopeless.  The patient is sad and depressed.  Thought  process.  He currently  denies any suicidal thoughts.  No obvious  delusional statements.  Thoughts are coherent and goal directed.  Cognitive function intact.  His memory is good.  Judgment and insight is  fair.  He appears sincere.   DIAGNOSES:  AXIS I:  Rule out bipolar disorder NOS.  Alcohol dependence,  partial remission.  AXIS II:  Deferred.  AXIS III:  Chronic bronchitis, congestive heart failure, type 2  diabetes, hyperlipidemia and status post polysubstance overdose.  AXIS IV:  Problems with primary support group, economic issues,  psychosocial problems.  Medical problems.  AXIS V:  Current is 25-30.   PLAN:    Will initiate the Depakote as a mood stabilize with the risk and  benefits discussed.  Will also have Risperdal available b.i.d. for  racing thoughts.  Will have Ambien for sleep.  The patient will be in  the dual diagnosis group.  Will continue to work on relapse and  stressors for alcohol use.  We offered a family session with the  patient's sons.  The patient is in agreement to have that.  Case manager will assess his  living situation.  The patient is planning to move out to have his own  place.  We will continue to monitor his medical issues, check his CBGs  and pulse ox, and the patient will have O2 available on a p.r.n. basis.  His tentative length of stay at this time is 4-6 days.      Landry Corporal, N.P.      Jasmine Pang, M.D.  Electronically Signed    JO/MEDQ  D:  04/04/2008  T:  04/04/2008  Job:  [id number]

## 2010-07-23 NOTE — Consult Note (Signed)
NAME:  Gregory Horne, Gregory Horne NO.:  [id number]   MEDICAL RECORD NO.:  [id number]          PATIENT TYPE:  INP   LOCATION:  2106                         FACILITY:  [facility]   PHYSICIAN:  Nelda Bucks, MD DATE OF BIRTH:  February 11, 1941   DATE OF CONSULTATION:  03/30/2008  DATE OF DISCHARGE:                                 CONSULTATION   HISTORY OF PRESENT ILLNESS:  The patient is a 70 year old white male  with past medical history of suicide attempt, congestive heart failure  with EF of 20-25%, and COPD. Patient presented with intentional  polypharmacy overdose.  The patient was found to be wobbly and unstable  by his wife in his garage.  The patient then reported consuming multiple  medicines including 40 tablets of Coreg, 3 tablets of Lyrica, 10 tablets  of Wellbutrin XL and some Zoloft.  The patient also provides history of  increased depression and anxiety over the last 62-month period, with  subjective weight loss.  He was being counseled by [facility] counselor for his  ongoing psychiatric problem. He is without any seizure activity, nausea,  or vomiting.  He had no complaint of fever, blood, or bowel  disturbances. His mental status is not altered.   PAST MEDICAL HISTORY:  1. Type 2 diabetes.  2. Suicidal attempt, October 2007.  3. Severe COPD treated November 2008.  4. Hyperlipidemia.  5. CHF with EF of 20-25%, nonischemic cardiopathy.  6. Depression.  7. Bipolar disorder.  8. Bladder carcinoma status post resection.  9. Colon polyps.  10.Cardiac pacer secondary to bradycardia and second-degree heart      block in 2006.  11.Status post cholecystectomy.   HOME MEDICATIONS:  1. Coreg 25 mg one-half tablet b.i.d.  2. Digoxin 0.125 mg daily.  3. Glipizide 10 mg daily.  4. Zoloft 100 mg daily.  5. Lasix 20 mg daily.  6. Omeprazole 20 mg daily.  7. Aspirin 81 mg daily.  8. Lisinopril 20 mg one-half every day.  9. Crestor 20 mg.  10.Symbicort 160/4.5 b.i.d.  11.Januvia 100 mg daily.  12.Wellbutrin 150 mg daily.   SOCIAL HISTORY:  The patient is married, has 3 children, and lives with  the wife.  He is retired from his work.   FAMILY HISTORY:  The patient's father had MI at age of 5.  Mother died  of MI at age of 9, and son has a coronary artery disease.   PHYSICAL EXAMINATION:  VITAL SIGNS:  Blood pressure 93/61 on  presentation, heart rate of 60, respiratory rate of 16, temperature of  98, and 95% O2 saturation on room air.  GENERAL:  The patient is alert and oriented x3.  The patient has mild  agitation, but no acute distress.  HEENT:  EOMI.  PERRLA.  No icterus.  Oral mucosa dry.  RESPIRATORY SYSTEM:  Clear to auscultation bilaterally.  No wheezing, no  rhonchi, no rales.  CARDIOVASCULAR:  S1 and S2 normal, tachycardia.  Normal rate and rhythm.  No murmur.  ABDOMEN:  Soft, distended abdomen, and nontender.  Bowel sounds present.  No hepatosplenomegaly.  CENTRAL NERVOUS SYSTEM:  Cranial nerves II through XII intact.  Motor  and sensory exam, no focal deficit.   LABORATORY DATA REVIEWED:  Hemoglobin 14.9, white blood cell count of  9.8, and platelet of 148.  Sodium 135, potassium 3.9, chloride 99,  bicarb 26, BUN 14, creatinine 1, platelet count of 178.  ASA level less  than 4, dig level 0.4, and APAP level less than 10.  Cardiac enzymes  negative.   ASSESSMENT AND PLAN:  1. Intentional polypharmacy overdose and suicidal attempt.  2. Chronic obstructive pulmonary disease.  3. Type 2 diabetes.  4. Congestive heart failure.  5. Best practices for intensive care unit.   PROBLEMS:  1. Neuro.  We will avoid giving any benzodiazepine or opioid for this      patient.  Dilantin or any other anti-seizure medication is not to      be started.  Psych consult will be obtained and sitter is made      available.  Home medications including Wellbutrin and Zoloft were      held.  We will look for withdrawal symptoms.  The patient does not       have any respiratory depression at present.  We will continue to      monitor.  2. Respiratory system.  O2 saturation more than 92%, no acute      distress.  Chest exam within normal limits.  No active management      necessary.  3. Cardiovascular system.  We are highly concerned about overdose with      the Coreg, which has beta-blocker activity.  Poison Control was      contacted.  The patient was put on glucagon infusion after a      loading dose of 5 mg with target of MAP more than 60.  Pharmacy was      contacted for confirmation of glucagon availability over the next      12 hours.  Central line access was obtained.  The patient should be      treated with dopamine 5 to 7.5 mcg/kg/minute infusion, if required      for blood pressure support.  If glucagon and dopamine failed to      support his blood pressure, Cardiology would be consulted for      possibility of increasing the pacer rate.  Cardiac enzymes were      negative at the time of admission and would be monitored for the      next 24 hours.  If required, volume repletion should be provided.  4. GI.  The patient will be kept n.p.o., in event intubation is      required. LFTs would be checked and Protonix was ordered for best      practices.  5. Psych exam.  Consult would be scheduled.  6. Renal.  BMET would be monitored every 8 hours as glucagon is      infused.  7. Infectious Diseases.  The patient is afebrile.  We will continue to      monitor for fever and leucocytosis  We will      monitor coagulopathy, aspirin to be started if within normal limit  8. Endocrine.  The patient is on glucagon, which results into      hyperglycemia.  Sliding scale insulin was started as the patient is      also a type 2 diabetic.      Clerance Lav, MD PhD  Electronically Signed      Reuel Boom  Daneil Dan, MD  Electronically Signed    RS/MEDQ  D:  03/31/2008  T:  03/31/2008  Job:  [id number]

## 2010-07-23 NOTE — Discharge Summary (Signed)
NAME:  Gregory Horne, Gregory Horne NO.:  [id number]   MEDICAL RECORD NO.:  [id number]          PATIENT TYPE:  INP   LOCATION:  3739                         FACILITY:  [facility]   PHYSICIAN:  Kari Baars, M.D.  DATE OF BIRTH:  [birth year]   DATE OF ADMISSION:  03/30/2008  DATE OF DISCHARGE:                               DISCHARGE SUMMARY   DISCHARGE DIAGNOSES:  1. Suicide attempt with polysubstance overdose (Coreg, Zoloft,      Wellbutrin, and Lodine).  2. Major depression/bipolar disorder with history of recurrent suicide      attempts (2006 and October 2007 requiring hospitalization).  3. Hypotension secondary to #1, resolved.  4. Acute exacerbation of chronic bronchitis.  5. Bradycardia secondary to #1.  6. Congestive heart failure, nonischemic cardiomyopathy (ejection      fraction 20-25%) with mild volume overload secondary to volume      resuscitation, resolved.  7. Severe chronic obstructive pulmonary disease (by pulmonary function      tests in November 2008).  8. Type 2 diabetes, well-controlled.  9. Hyperlipidemia.  10.Status post pacemaker secondary to bradycardias/a second-degree      heart block (2006).  11.History of bladder cancer status post resection.  12.Colon polyps.  13.Status post cholecystectomy.  14.Status post left eye brow lift.  15.History of alcoholism.   DISCHARGE MEDICATIONS:  1. Coreg 25 mg 1/2 b.i.d.  2. Digoxin 0.125 mg daily.  3. Glipizide 10 mg b.i.d.  4. Furosemide 20 mg daily.  5. Omeprazole 20 mg daily.  6. Aspirin 81 mg daily.  7. Lisinopril 20 mg 1/2 daily.  8. Crestor 20 mg daily.  9. Symbicort 160/4.5 two puffs b.i.d.  10.Januvia 100 mg daily.  11.Avelox 400 mg daily x7 days.   HOSPITAL PROCEDURES:  1. Glucagon drip.  2. Right femoral triple-lumen catheter placement.  3. Intensive care unit monitoring.   HOSPITAL CONSULTS:  1. Pulmonary critical care medicine (Dr. Rory Percy).  2. Cardiology [facility]  and Vascular).  3. Psychiatry (Dr. Antonietta Breach).   HISTORY OF PRESENT ILLNESS:  For full details please see dictated  history and physical.  Briefly, Gregory Horne is a 70 year old white male  with a history of bipolar disorder/major depression with a history of  recurrent suicide attempts, congestive heart failure (ejection fraction  20-25%), and severe COPD who presented to the emergency department on  January 21 with an intentional overdose.  The patient and his wife  reported increasing depression and anxiety symptoms over the past month  attributed to financial, social, and family stressors.  Associated  symptoms included significant weight loss.  He was actually seen in my  office for his physical on March 23, 2008 at which time he did report  increased depression, but denied suicidal or homicidal ideation.  He  stated that he was seeing a Veterinary surgeon at the [place].  At that time we  increased his Zoloft from 100 mg to 200 mg, and recommended Wellbutrin  XL 150 mg daily.  He did not start the Wellbutrin, but reported to me  that he did increase the Zoloft..  There is  question whether he was  actually taking this though based on other physician discussions.  He  was apparently feeling a little bit better on the morning prior to his  overdose attempt, but his wife stated that when she came home at around  10 a.m. he stood up and then slumped over and she caught him before he  hit the ground.  She believes and he reported that he took about 40  Coreg 25 mg tablets, 10 Zoloft 100 mg pills, 1 Lodine, 2 Lyrica, and 10  of his wife's Wellbutrin XL 150 mg pills.  Upon presentation to the  emergency department he was found to be hypotensive with a blood  pressure of 68/48, heart rate of 60 which was paced.  He was started on  glucagon drip, and I was consulted for admission.   HOSPITAL COURSE:  The patient was admitted to an ICU bed.  He was  vigorously hydrated for his hypotension, and his  glucagon drip was  titrated up for blood pressure control.  Within 24 hours his hypotension  had resolved, and his pulse was higher than his paced rhythm.  Critical  care medicine and cardiology consults were obtained initially.  A right  triple lumen catheter was placed with the intention of starting dopamine  for vasopressor support.  However, he never required this.  Post  hydration he did develop some mild volume overload with increasing  shortness of breath and mild heart failure symptoms.  This responded to  diuresis, and he has been weaned off of oxygen.  He was continued on his  home bronchodilators with control of his COPD.  He does report a  slightly increased purulent sputum production.  He has a slight low  grade fever and mildly elevated white blood count, and will be started  on Avelox for acute exacerbation of chronic bronchitis.  He feels well,  and is medically stable for transfer to inpatient psychiatry.   The patient continues to exhibit signs of hopelessness.  He denies  current suicidal intent, and has been on suicide precautions throughout  his hospitalization with a sitter at the bedside.  He was seen by  psychiatry initially Dr. Antonietta Breach who recommended inpatient  psychiatry.  Initially the patient was reluctant, but is now willing to  go on his own accord and will not require commitment.  He was also seen  by Dr. Milford Cage who felt that if he refused  involuntary commitment  that he would need to be committed to [facility].  At this point  the patient is stable for discharge, and requires intensive psychiatry  management given his history of repeated suicide attempts.  He has been  poorly connected with psychiatry in the community as most of his care is  provided through the [place].  Will need to ensure that he has adequate  psychiatry follow-up prior to discharge from inpatient psychiatry.   DISCHARGE LABORATORIES:  On the day of discharge white  blood count 12.2,  hemoglobin 12, platelets 120 which are stable.  BMET significant for  sodium 135, potassium 3.3, chloride 98, bicarb 28, BUN 9, creatinine  0.8, glucose 175.  INR has been stable at 1.0.  Liver function tests are  normal with a mildly elevated bilirubin of 1.3.   DISCHARGE DIET:  Cardiac-prudent.   DISPOSITION:  To Behavioral Health/inpatient psychiatry pending bed  availability.      Kari Baars, M.D.  Electronically Signed     WS/MEDQ  D:  04/03/2008  T:  04/03/2008  Job:  [id number]   cc:   Thereasa Solo. Little, M.D.

## 2010-07-23 NOTE — Discharge Summary (Signed)
NAME:  Gregory Horne, FRISK NO.:  [id number]   MEDICAL RECORD NO.:  [id number]          PATIENT TYPE:  IPS   LOCATION:  0302                          FACILITY:  [facility]   PHYSICIAN:  Jasmine Pang, M.D. DATE OF BIRTH:  12/19/1940   DATE OF ADMISSION:  04/03/2008  DATE OF DISCHARGE:  04/05/2008                               DISCHARGE SUMMARY   IDENTIFICATION:  This is a 70 year old married white male who was  admitted on a voluntary basis on April 03, 2008.   HISTORY OF PRESENT ILLNESS:  The patient reports intentional overdose on  multiple medications.  He states he was taking his home medications,  taking approximately 40 Coreg tablets, ten 100-mg Zoloft, and ten 150-mg  Wellbutrin.  He overdosed at home.  His wife found him.  The patient was  taken to the hospital.  He was medically stable and transferred here for  further assessment of his depression and suicide attempt.  He states  today that he is not sure why he did it.  He was very impulsive.  He  does report significant stressor stating that he is experiencing a  multitude of things, separation from his wife and having significant  financial issues.  He has a history of alcohol use, but states he has  not done any drinking for the past 2 months.  The patient is endorsing  racing thoughts at bedtime and having difficulty sleeping.  He has been  sleeping approximately 3 to 4 hours at a time.  He has a loss of  appetite with some associated weight loss as well.  He also endorses  having mood swings.   PAST PSYCHIATRIC HISTORY:  The patient was here before in October 2007  for an overdose at that time.  He has no current mental health  providers, but feels today that he has probably had mental illness  problems since childhood.   FAMILY HISTORY:  The patient denies.   ALCOHOL AND DRUG HISTORY:  The patient states that he has a long history  of drinking, but stopped drinking about 2 months ago.  He felt like  he  needed too.  He denied any withdrawal symptoms after he stopped.  He  denied any other recreational drug use.   MEDICAL PROBLEMS:  History of chronic bronchitis, congestive heart  failure, type 2 diabetes, pacemaker, hyperlipidemia, history of bladder  cancer, status post resection.   MEDICATIONS:  See hospital course.   DRUG ALLERGIES:  No known drug allergies.   PHYSICAL FINDINGS:  There were no acute physical or medical problems  noted.  He was fully assessed at [facility] Unit.   ADMISSION LABORATORIES:  INR was 1.  Potassium was 3.3.  WBC count was  12.2, platelet count 120, hemoglobin A1c was 8.6.  Glucose was 175.  His  salicylate level was less than 4.  Acetaminophen level less than 10.   HOSPITAL COURSE:  Upon admission, the patient was restarted on his home  medications with Coreg 25 mg p.o. 1-1/2 tablet b.i.d., digoxin 0.125 mg  p.o. daily, glipizide 10 mg  twice daily, furosemide 20 mg daily,  omeprazole 20 mg daily, aspirin 81 mg daily, lisinopril 10 mg daily,  Crestor 20 mg daily, Symbicort 160/4.5 mcg inhaler 2 puffs b.i.d.,  Januvia 100 mg p.o. q. day, and Avelox 400 mg p.o. q. day x7 days.  He  was also started on Ambien 10 mg p.o. q.h.s. may repeat x1 and Risperdal  M-Tab 0.5 mg now.  On April 04, 2008, he was also started on Depakote  ER 500 mg p.o. q.h.s. and Risperdal 0.5 mg q.a.m. and q.h.s.  In  individual sessions with me, the patient was friendly and cooperative.  I admit him 2 days before while he was in the main hospital where he was  being treated for his overdose.  He remembered me from that meeting.  The patient stated his stressors includes marriage not going well and  financial issues.  He has no current psychiatric treatment.  He states  he does not sleep well.  He has to make himself eat.  He has been  depressed for a long time.  He states that he does have mood swings  and feels himself having highs and lows.  He states he  has had mood  problems since childhood.  As indicated above, he was started on the  Depakote and Risperdal for mood stabilization.  On April 05, 2008,  appetite was good.  Sleep was fair.  He did not like the Ambien and I  changed him to Restoril 15 mg p.o. q.h.s. may repeat x1.  His mood was  less depressed, less anxious.  Affect was consistent with mood.  There  was no suicidal or homicidal ideation.  No thoughts of self-injurious  behavior.  No auditory or visual hallucinations.  No paranoia or  delusions.  Thoughts were logical and goal-directed.  Thought content no  predominant theme.  Cognitive was grossly intact.  Insight good.  Judgment good.  It was felt the patient was ready for discharge.  He was  going to go to an assisted-living home in [place] and was happy with  this placement.  His sons have been quite involved during his  hospitalization and they were pleased with this transition as well.   DISCHARGE DIAGNOSES:  Axis I:  Features of bipolar disorder, not  otherwise specified, alcohol dependence, partial remission.  Axis II:  None.  Axis III:  Chronic bronchitis, congestive heart failure, type 2  diabetes, hyperlipidemia, and status post polysubstance overdose.  Axis IV:  Severe (problems with primary support group, economic issues,  psychosocial problems, medical problems, burden of psychiatric illness).  Axis V:  Global assessment of functioning was 50 at discharge.  GAF was  25 to 30 upon admission.  GAF highest past year was 65 to 70.   DISCHARGE PLANS:  There was no specific activity level or dietary  restrictions.   POSTHOSPITAL CARE PLANS:  The patient will see Dr. Bayard Males on  February 1st at 9 a.m. for psychiatric treatment.  He will also see  Terald Sleeper, counselor in Dr. Karna Dupes office on January 23rd at 10  a.m.   DISCHARGE MEDICATIONS:  1. Coreg 12.5 mg p.o. b.i.d. with food.  2. Lanoxin 0.125 mg daily.  3. Glucotrol 10 mg twice daily before  meals.  4. Lasix 20 mg daily.  5. Aspirin 81 mg daily.  6. Prinivil 10 mg daily.  7. Crestor were 20 mg daily.  8. Symbicort 160/4.5 mcg 2 puffs twice daily.  9. Januvia 100 mg  daily.  10.Avelox 400 mg p.o. daily x5 more days, then stop.  11.Omeprazole 20 mg daily.  12.Depakote ER 500 mg at bedtime.   He is also ordered to get a CBGs b.i.d. and is to follow up with his  medical doctor for his health problems in 1 week.      Jasmine Pang, M.D.  Electronically Signed     BHS/MEDQ  D:  04/05/2008  T:  04/06/2008  Job:  [id number]

## 2010-07-26 NOTE — Op Note (Signed)
NAME:  Gregory Horne, Gregory Horne NO.:  [id number]   MEDICAL RECORD NO.:  [id number]          PATIENT TYPE:  AMB   LOCATION:  NESC                         FACILITY[facility]  [facility]   PHYSICIAN:  Bertram Millard. Dahlstedt, M.D.DATE OF BIRTH:  05/08/1940   DATE OF PROCEDURE:  06/27/2004  DATE OF DISCHARGE:                                 OPERATIVE REPORT   PREOPERATIVE DIAGNOSIS:  Bladder tumor.   POSTOPERATIVE DIAGNOSIS:  Bladder tumor.   PRINCIPAL PROCEDURES:  Cystoscopy, transurethral resection of bladder tumor.   SURGEON:  Bertram Millard. Dahlstedt, M.D.   ANESTHESIA:  General LMA.   COMPLICATIONS:  None.   BRIEF HISTORY:  A 70 year old male who presented to me two days ago with  gross hematuria.  He was found to have a 2 cm bladder lesion in the trigonal  area to the right side.  No other bladder lesions were seen.  He presents at  this time for TURBT.  He is aware of the risks and complications and desires  to proceed.   DESCRIPTION OF PROCEDURE:  The patient was administered preoperative IV  antibiotics and taken to the operating room, where a general anesthetic was  administered using the LMA.  He was placed in the dorsal lithotomy position.  Genitalia and perineum were prepped and draped.  A 22 French panendoscope  was advanced into his bladder.  Prostate was not really obstructive.  He had  a high-riding bladder neck posteriorly.  The bladder was inspected.  There  were significant trabeculations and some cellules.  No stones were seen.  There was a 2 cm lesion on a stalk in the right trigonal area just medial to  the right ureteral orifice.  Circumferential inspection of the bladder  revealed no other bladder tumors.  The cold cup biopsy forceps were used to  grasp the tumor at its base and remove it.  The tumor was close enough to  the ureteral orifice to where I did not want to get much deeper biopsies.  The Bugbee electrode was used to buzz the bladder base and cauterize  bleeding vessels.   At this time, the specimen was sent to pathology.  The bladder was drained  and the procedure terminated.  The patient tolerated the procedure well.  He  was awakened and taken to the PACU in stable condition.   He was discharged home on five days of Cipro daily; Urelle tablets for  discomfort; and Vicodin for pain.  He will follow up in one week.      SMD/MEDQ  D:  06/27/2004  T:  06/27/2004  Job:  [id number]   cc:   Wilson Singer, M.D.  [address]  [place]  [place] [address]  Fax: [phone]   Thereasa Solo. Little, M.D.  [address]  [place]  [place] [address]  Fax: [phone]

## 2010-07-26 NOTE — Discharge Summary (Signed)
[place]  Patient:    Gregory Horne, Gregory Horne Visit Number: [id number] MRN: [id number]          Service Type: PSY Location: [facility] Attending Physician:  Benjaman Pott Dictated by:   Tyson Dense, M.D. Admit Date:  05/17/2001 Disc. Date: 05/13/01                             Discharge Summary  DATE OF BIRTH:  1940/12/27  DISCHARGE DIAGNOSES: 1. Suicide attempt. 2. Drug overdose - digoxin toxicity. 3. Acetaminophen toxicity secondary to overdose. 4. Mental status changes.  DISCHARGE MEDICATIONS: 1. Cozaar 50 mg q.d. 2. Protonix 40 mg q.d. 3. Glucotrol XL 5 mg q.d. 4. Coreg 3.125 p.o. b.i.d. 5. Aspirin 81 mg q.d. 6. Digoxin will be started on May 14, 2001 at 0.125 q.d. 7. Lipitor 10 mg q.d.  CONDITION ON DISCHARGE:  Stable.  The patient was transferred to [facility] under psychiatric inpatient care.  As far as medically, he was stable.  HOSPITAL COURSE: #1 - DIGOXIN TOXICITY:  The patient is 70 years old admitted with suicide attempt with drug overdose.  The patient took Excedrin P.M. and also digoxin and was admitted with the digoxin toxicity level of 3.1 and acetaminophen level of 58.3 after 12 hours.  His other lab work including electrolytes was unremarkable, blood count and hemoglobin were normal, LFTs were normal, EKG showed left bundle branch block with some prolonged QT, he had underlying left bundle branch block.  For drug overdose the patient was admitted to telemetry, had Digibind as per the pharmacy protocol and continued to watch his EKGs and rhythm strip and digoxin level finally went down to 2.0 and EKG remained stable and hemodynamically remained stable.  #2 - ACETAMINOPHEN TOXICITY LEVEL OF 58.3 AFTER 12 HOURS:  Started with normal LFTs.  Started on Mucomyst protocol.  The patient had loading dose of Mucomyst and then had subsequently six doses and as per the [facility] his level was  repeated and was undetectable and LFTs were normal.  His Mucomyst was stopped after the sixth dose.  #3 - SUICIDE ATTEMPT:  The patient had history of depression and underwent a psychiatric evaluation, seen by Dr. Jeanie Sewer at the hospital and agreed that after he was medically cleared he would go to inpatient Behavioral Health.  #4 - HISTORY OF IDIOPATHIC CARDIOMYOPATHY AND EJECTION FRACTION OF 35-40%: His cardiologist, Dr. Caprice Kluver, was notified.  His digoxin was initially held and also his Demadex.  The patient remained stable without any evidence of CHF and as far as his medication we will resume on May 14, 2001 digoxin at lower dose as well as his Demadex.  #5 - DIABETES:  His sugars remain stable. Dictated by:   Tyson Dense, M.D. Attending Physician:  Carolanne Grumbling D DD:  05/14/01 TD:  05/17/01 Job: [id number]

## 2010-07-26 NOTE — Consult Note (Signed)
NAME:  Gregory Horne, LUECKE NO.:  [id number]   MEDICAL RECORD NO.:  [id number]          PATIENT TYPE:  INP   LOCATION:  4703                         FACILITY:  [facility]   PHYSICIAN:  Mark E. Severiano Gilbert, M.D.    DATE OF BIRTH:  01/16/1941   DATE OF CONSULTATION:  05/21/2004  DATE OF DISCHARGE:                                   CONSULTATION   SOURCE OF CONSULTATION:  Dr. Caprice Kluver.   REASON FOR CONSULTATION:  Complete heartblock.   HISTORY OF PRESENT ILLNESS:  A 70 year old white male who complains of  approximately 4 weeks of increasing fatigue, lack of stamina, mild shortness  of breath with exertion.  He was recently seen in the [place] and was noted to  have a heart rate less than 40 beats per minute; however, no additional  evaluation was performed.  He has not had syncope or presyncopal symptoms,  has not had worsening congestive heart failure.  He has had no chest pain to  suggest an ischemic substrate.  He was seen in the clinic today by Al Little  and was noted to have a bradycardia.  A 12-lead electrocardiogram was done  which demonstrated complete heartblock with a wide complex escape which  appeared to be junctional based on his longstanding left bundle branch  block.  He was admitted to the hospital and asked me to see the patient for  evaluation of complete heartblock and need for pacemaker implantation.   PAST MEDICAL HISTORY:  1.  Idiopathic nonischemic cardiomyopathy, EF less than 30%.  2.  Chronic left bundle branch block.  3.  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  4.  Mild mitral regurgitation.  5.  Dyslipidemia.  6.  Hypertension.  7.  Diabetes mellitus.  8.  Tobacco use.  9.  History of depression.   Interesting enough, he does not have a history of acute myocardial  infarction and already has congestive heart failure, he states he is Class 1  without limitations.   MEDICATIONS:  1.  Protonix 40 mg p.o. daily.  2.  Zoloft 50  mg p.o. daily.  3.  Glucotrol XL 5 mg daily.  4.  Demadex 20 mg p.o. daily.  5.  Aspirin is held.  6.  Coreg is being held.  7.  Digoxin has been held.  8.  Vytorin 10/40 combination.   SOCIAL HISTORY:  He is a smoker.  He is married.  Actively walks.   FAMILY HISTORY:  No premature atherosclerotic coronary disease.   REVIEW OF SYSTEMS:  Weight has been stable.  No fever, sweats or chills.  No  real arthritic complaints.  Does not wear eye glasses.  SKIN: No easy  bruisability.  PULMONARY: No chronic obstructive pulmonary disease symptoms.  CARDIOVASCULAR: As per HPI.  GI: Negative except for chronic dyspepsia.  GU:  Negative for dysuria.  He is diabetic but has occasional symptoms of  hyperglycemia.   ALLERGIES:  NO KNOWN DRUG ALLERGIES.   NEURO:  No recent stroke, TI or seizure symptoms.   HEENT:   PSYCH:  Depression, as commented in  HPI.   PHYSICAL EXAM:  Blood pressure 158/68, heart rate 30, regular, respirations  20, temperature 98.8, sating 93% on room air.  Well-developed, well-  nourished white male in no acute distress.  HEENT:  Atraumatic, normocephalic.  Pupils equal, round, react with light  and accommodation.  Midline nasal septum, moist mucous membranes oropharynx,  no scleral icterus.  NECK:  Supple, 2+ equal carotids, no bruit, no JVD, no thyromegaly, no  adenopathy.  CHEST:  Normal male.  Adequate tissue for implantation of pacemaker left  pectoral area.  BACK:  no CVAT.  RESPIRATORY:  Clear to auscultation and percussion.  CARDIOVASCULAR:  Regular rate, rhythm without murmur.  Normal S1, S2.  No  S3, S4.  No gallops, rubs, thrills.  ABDOMEN:  Soft, nontender, positive bowel sounds, no hepatosplenomegaly.  EXTREMITIES:  No cyanosis, clubbing or edema.  Peripheral pulses 2+ and  equal.  NEURO:  Alert and oriented x4.  Motor sensory nonfocal.  Cranial nerves II-  XII were intact.  SKIN:  No areas of easy bruisability or breakdown.  PSYCH:  Affect  appropriate.   Electrogram: Normal sinus rhythm, complete heartblock, left bundle branch  block consistent with junctional escape 30-35 beats per minute.   LABORATORY PROFILE:  White count 9400, platelet count 173,000, potassium  4.7, creatinine 1.0, INR .9, LFTs were negative.   IMPRESSION:  Complete heartblock.  Does not appear to be any reversible  cause.  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  I think he has done this this time.  He has recently had a  reduction in digoxin dose and beta-blockers which may improve his rate,  although probably not very much.  This has been going on for one month, so I  do not think we need to use temporary pacing.  I do think he needs to have  implantation of a dual chamber pacemaker set  up at an early as possible convenient day.  I doubt they would be able to  him today, because of lab and staff unavailability after 6:00, we will have  to delay and do him tomorrow.   PLAN:  Permanent cardiac pacemaker implantation in the morning.      MEP/MEDQ  D:  05/21/2004  T:  05/21/2004  Job:  [id number]

## 2010-07-26 NOTE — Cardiovascular Report (Signed)
NAME:  Gregory Horne, SCALA NO.:  [id number]   MEDICAL RECORD NO.:  [id number]          PATIENT TYPE:  INP   LOCATION:  4703                         FACILITY:  [facility]   PHYSICIAN:  Mark E. Severiano Gilbert, M.D.    DATE OF BIRTH:  1940/06/11   DATE OF PROCEDURE:  05/22/2004  DATE OF DISCHARGE:                              CARDIAC CATHETERIZATION   PROCEDURES PERFORMED:  1.  Dual-chamber permanent cardiac pacemaker implantation.  2.  Fluoroscopy for implantation.  3.  Left axillary venography.   PREPROCEDURE DIAGNOSIS:  Complete heart block with symptomatic bradycardia.   POSTPROCEDURE DIAGNOSIS:  Complete heart block with symptomatic bradycardia.   OPERATOR:  Mark E. Peele, M.D.   ESTIMATED BLOOD LOSS:  Less than 30 mL.   COMPLICATIONS:  None.   PROCEDURE IN DETAIL:  The patient was brought to the cardiac catheterization  laboratory in a fasted state.  The patient's left pectoral region was  prepped and draped in the usual manner.  Conscious sedation obtained with  intermittent injections of Versed.  Anesthesia with 1% local lidocaine  subcutaneously.  Ancef 1 g IV immediately prior to the skin incision for  antimicrobial prophylaxis.  A 5 cm incision line 2 cm inferior to the left  clavicle reaching the deltopectoral groove was made to the prepectoral  fascia using a combination of sharp and blunt dissection.  Electrocautery  was used to ensure hemostasis.  Inferior to the incision line along the  prepectoral fascia by blunt dissection, an appropriately sized pacemaker  pocket was formed.  The pocket was copiously irrigated with antibiotics.  Next using a thin wall needle technique, two 7 French Safe sheath systems  were placed within the left axillary vein after left axillary venography was  performed using an extrathoracic first rib approach.  Under fluoroscopic  guidance, a St. Jude model [id number] active fixation atrial lead was advanced  into the right atrial  appendage.  The appendage was mapped in three sites  before a suitable site with both anatomic stability and adequate pacing  thresholds was determined.  The lead was then sutured in place using 0 silk  ligatures.  Next, a St. Jude model [phone] active fixation ventricular lead  was advanced fluoroscopically into the right ventricular appendage carefully  to avoid significant ectopy as the patient had a very poor escape with left  bundle branch block.  The right ventricular apex was easily reached and the  lead was actively fixed without difficulty.  The lead was sutured in place  to the prepectoral fascia using 0 silk ligatures after determining  electronic characteristics.  Next the leads were connected to a St. Jude  model [id number], serial [id number], dual-chamber pacemaker, which was then placed  within the preformed pacemaker pocket.  The pocket was closed in layers of 2-  0 and 4-0 Vicryl.  The skin incision was reinforced with Steri-Strips and a  pressure dressing was applied.   The patient tolerated the procedure well and was returned to the holding  area in stable hemodynamic condition to receive postoperative antibiotics  and have monitoring.   The electronic  characteristics of the atrial lead demonstrated sensing T  waves of 2.5 and 3.5, impendence 450 ohms and threshold between 1.5-2 volts  at 0.25 msec.  The ventricular lead demonstrated electronic characteristics  of RV  23.4 volts, impendence 856 ohms and capture threshold 0.6 volts at 0.5 msec.  The device was programmed to a dual-chamber nonrate responsive rate, lower  rate 60 and upper rate 120 and nominal AV delays.  Outputs in the A and V  were 5 volts at 7.5 msec.      MEP/MEDQ  D:  05/22/2004  T:  05/22/2004  Job:  [id number]

## 2010-07-26 NOTE — H&P (Signed)
NAME:  Gregory Horne, Gregory Horne              ACCOUNT NO.:  [id number]   MEDICAL RECORD NO.:  [id number]          PATIENT TYPE:  EMS   LOCATION:  MINO                         FACILITY:  [facility]   PHYSICIAN:  Wilson Singer, M.D.DATE OF BIRTH:  29-May-1940   DATE OF ADMISSION:  12/15/2005  DATE OF DISCHARGE:                                HISTORY & PHYSICAL   CHIEF COMPLAINT:  Forgetfulness for two weeks and chest pain for one day.   HISTORY OF PRESENT ILLNESS:  This is a 70 year old male with multiple  medical problems, hypertension, diabetes, idiopathic cardiomyopathy with  ejection fraction 24%, complete heart block status post pacemaker which was  done in March 2006.  The patient presented to the ED with increased  forgetfulness for almost one week.  The patient admitted he has the  condition associated with insomnia and difficulty sleeping for more than  four days.  He denies any fever.  He denies any headache.  The patient  admitted that these days he has not been eating very well.  He has  generalized weakness and drinking a lot of water.  He had received a call  from his PMD today informing him that his sodium level was low.  The patient  also, in the ED, admitted he had chest pain, chest pain mainly retrosternal,  not radiating, not associated with nausea, vomiting, or sweating.  The pain  lasted five minutes but then is gone, according to him.  No shortness of  breath, no weakness of the extremities or numbness.   PAST MEDICAL HISTORY:  Hypertension, diabetes, idiopathic cardiomyopathy,  ejection fraction 24%, asthma/COPD, complete heart block status post  pacemaker, depression, mild mitral regurgitation, chronic left bundle branch  block.   PAST SURGICAL HISTORY:  History of cholecystectomy, history of liver cancer  status post surgery in 2006, status post pacemaker.   ALLERGIES:  No known drug allergies.   MEDICATIONS AT HOME:  1. Digoxin 0.125 mg p.o. daily.  2. Coreg 25 mg  p.o. b.i.d.  3. Glipizide 10 mg p.o. daily.  4. Lipitor 8 mg p.o. q.h.s.  5. Furosemide 20 mg p.o. daily.  6. Lasix 20 mg p.o. daily.  7. Ambien 12.5 mg p.o. h.s.   SOCIAL HISTORY:  The patient has a chronic smoking history, more than one  pack per day for more than 50 years, history of alcoholism drinking alcohol  every other day or three times a week for more than 50 years.  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   SYSTEMIC REVIEW:  The patient denies any weight loss, no night sweats, he  does not have a fever.  CARDIOVASCULAR:  Denies any palpitations, no  lymphedema, no orthopnea.  GI SYSTEM:  Denies nausea and vomiting.  MUSCULOSKELETAL SYSTEM:  Denies any body aches.  DERMATOLOGICAL:  There are  no rashes.  NEUROLOGICAL:  Denies any foot drop, denies any weakness, or  loss of sensation.   PHYSICAL EXAMINATION:  VITAL SIGNS:  In the ED, vital signs revealed temperature 98.6, pulse 84,  respiratory rate 20, oxygen saturation 95%  on room air.  HEENT:  Normocephalic, atraumatic, sclerae nonicteric, oral mucosa slightly  dry.  NECK:  Supple, no thyromegaly.  CARDIAC:  Regular rate and rhythm.  No murmur, no gallops.  LUNGS:  Mild basilar crackles and diminished air entry bilaterally.  ABDOMEN:  Soft, nontender, nondistended, normal active bowel sounds, no  masses appreciated.  EXTREMITIES:  Peripheral pulses intact, there is no peripheral edema.  NEUROLOGICAL:  The patient is alert and oriented x3, cranial nerves 2-12  grossly intact.  He is appropriate.  He is cooperative.  He has 5/5  strength.  There is no sensory loss.   LABORATORY FINDINGS:  White blood cell count 13.1, hemoglobin 13.9,  hematocrit 41.1, platelets 201.  Sodium 126, potassium 4, chloride 90,  bicarb 27, BUN 12, creatinine 0.7.  CK MB 3 and troponin less than 0.05.  AST 31, ALT 31, alkaline phos 55.  Chest x-ray showed mild progressive  enlargement of the heart with increased prominence of the  pulmonary line,  sign of COPD.  Alcohol level was less than 5.  CT scan of the head,  preliminary report, no masses, no hemorrhage.   ASSESSMENT/PLAN:  1. Chest pain.  With regards to the patient's history, high risk patient      with increased smoking and diabetes, hypertension, will admit the      patient to telemetry.  Will obtain a set of troponin, CK, and MB.  The      patient has a history of idiopathic cardiomyopathy but we cannot rule      out acute pulmonary syndrome.  We will start the patient on therapeutic      Lovenox and aspirin.  We will call cardiology for consult.  2. Regarding increased forgetfulness and difficulty sleeping, possibly due      to hyponatremia or underlying infection.  Will order serum osmolarity,      plasma osmolarity, urine sodium, and will start normal saline at 40 mL      per hour, because of the risk the patient may go to pulmonary edema,      and follow INRs, and repeat CMP in the morning.  Will hold Furosemide      for now.  3. Hyperlipidemia.  Will obtain lipid profile and continue with Lipitor.  4. Hypertension is stable.  5. Diabetes.  Will put him on sliding scale plus his own medication, will      hold Metformin.  6. Alcohol abuse.  Will start the patient on Librium 25 mg p.o. q.8h. to      be tapered down to avoid DTs.  We are going to start the patient on      folic acid and thiamine.      [name], N.P.    ______________________________  Wilson Singer, M.D.    MES/MEDQ  D:  12/15/2005  T:  12/16/2005  Job:  [id number]

## 2010-07-26 NOTE — Discharge Summary (Signed)
[facility]  Patient:    Gregory Horne, Gregory Horne Visit Number: [id number] MRN: [id number]          Service Type: PSY Location: [facility] Attending Physician:  Benjaman Pott Dictated by:   Netta Cedars, M.D. Admit Date:  05/17/2001 Discharge Date: 05/21/2001                             Discharge Summary  INTRODUCTION:  Mr. Gregory Horne is a 70 year old white male admitted from voluntary papers after intentional overdose of Lanoxin.  After being observed on medical unit, he was transferred to psychiatry after medically cleared.  He gave history of being stressed out and overwhelmed.  There was obvious marital stress and problem related to patients physical illness which has severely limited his activities.  The patient, for past year, used to abuse alcohol but, prior to admission, was sober for three months and attending AA groups. Four days prior to admission, he was started on Paxil by family physician but, after a few days, stopped since he learned that one of his friends developed suicidal thoughts while on Paxil.  Subsequently, family doctor started him on Lexapro but patient stopped taking medication after 10 days.  PAST MEDICAL HISTORY:  Type 2 diabetes mellitus, GERD, single vessel coronary artery disease, cardiomyopathy with ejection fraction varying between 25-70% and chronic left bundle-branch block.  He also has history of cholecystectomy and left knee surgery.  HOSPITAL COURSE:  After admission to the ward, patient was placed on special observation.  He was started on his "medical medication" which include Lipitor, Demadex, digoxin, Glucotrol, Cozaar, Coreg and simethicone.  After talking to him, I decided to start him on Celexa but patient complained that he has "dizzy feeling" and did not want to take his medication.  He was initially seen by Dr. Dub Mikes.  I saw patient while on-call on May 16, 2000. Previously, on May 15, 2001, he was seen  by Dr. Katrinka Blazing.  At that time, he was still depressed but wanted to be discharged as soon as possible in order to finish some tasks at home.  On May 16, 2001, patient still depressed and has been shocked with bad outcome of session with his wife.  Apparently, wife wanted at least temporary separation.  Denied suicidal thoughts.  He feels that he has to move on and start doing things.  Admitted that he wanted to die prior to his hospital admission but now he is feeling that he is going to be okay.  The patient demanded discharge as soon as possible.  I wanted a second family meeting before discharge could be completed and patient agreed to referral to intensive outpatient program.  Later, patient was seen by social worker, who also had meeting with patients sons.  I had a meeting with patients two sons on May 16, 2001.  They were very supportive feeling that they could provide a safe environment in their homes for their father.  The patient admitted to being depressed but he wants to try the treatment.  He did not want to take medication but agreed to go to therapy, not the intensive outpatient program.  I felt, at that point, the patient will be safe with his sons.  The benefits of being with his family outweighed the risk for suicide. I also felt that prolonged stress, if confined to the hospital against his will, could affect his cardiac condition.  Therefore, decision was made  to discharge the patient home in care of his family.  DISCHARGE DIAGNOSES: Axis I:    Major depressive disorder, severe, single episode. Axis II:   No diagnosis. Axis III:  1. Coronary artery disease.            2. Hypertension.            3. Gastroesophageal reflux disease.            4. Type 2 diabetes mellitus.            5. Idiopathic cardiomyopathy.            6. Status post overdose on digoxin. Axis IV: Axis V:  LABORATORY DATA:  No blood work was done while on the unit.  Basically, patients blood  work done on medical unit was within normal limits including CBC, chemistry 17.  DISCHARGE MEDICATIONS: 1. Lipitor 10 mg daily. 2. Demadex 20 mg daily. 3. Digoxin 0.125 mg daily. 4. Glucotrol XL 5 mg daily. 5. Cozaar 50 mg daily. 6. Coreg 3.125 mg, 1 twice a day. 7. Aspirin 81 mg, 1 daily. 8. Nitroglycerin 0.4 mg p.r.n. chest pain. 9. Ambien 10 mg, 1/2 or 1 as needed for insomnia.  As mentioned before, patient refused antidepressant medication.  DISCHARGE RECOMMENDATIONS:  He is supposed to stay on diabetic diet as ordered by his family physician.  He should come to emergency room if recurrence of suicidal thoughts.  The patients family will secure his medications and, until his depression resolves, he will stay with one of his sons.  The patient promised to come to intensive outpatient program on May 17, 2001 at 9 a.m. to receive further treatment for depression.  His sons guaranteed his treatment and safety. Dictated by:   Netta Cedars, M.D. Attending Physician:  Carolanne Grumbling D DD:  07/06/01 TD:  07/06/01 Job: [id number]

## 2010-07-26 NOTE — Discharge Summary (Signed)
NAME:  Gregory Horne, Gregory Horne NO.:  [id number]   MEDICAL RECORD NO.:  [id number]          PATIENT TYPE:  INP   LOCATION:  4703                         FACILITY:  [facility]   PHYSICIAN:  Thereasa Solo. Little, M.D. DATE OF BIRTH:  [birth year]   DATE OF ADMISSION:  05/21/2004  DATE OF DISCHARGE:  05/23/2004                                 DISCHARGE SUMMARY   DISCHARGE DIAGNOSES:  1.  Symptomatic complete heart block.  2.  Idiopathic cardiomyopathy, ejection fraction 24%.  3.  Mild coronary disease.  4.  Chronic left bundle branch block.  5.  Diabetes mellitus type 2.  6.  Mild mitral regurgitation.   PROCEDURES:  Implantation of Identity ADX Frans[employer] DDDR  pacemaker serial [id number].  This was done by Dr. Launa Grill.   CONDITION ON DISCHARGE:  Improved.   DISCHARGE MEDICATIONS:  1.  Coreg 12.5 mg twice a day.  2.  Digitek 0.125 daily.  3.  Prilosec 20 mg daily.  4.  Zoloft 100 mg half daily.  5.  Glucotrol XL 5 mg daily.  6.  Demadex 20 mg daily.  7.  Nitroglycerin 1/150 as needed as before.  8.  Enteric-coated aspirin 81 mg daily.  9.  Vytorin 10/20 one daily.  10. Ultram 50 mg one every eight hours as needed for pacer incision pain.  11. Take extra-strength Tylenol for incisional pain or may take Ultram.   ACTIVITY:  As per discharge pacemaker sheet.   DIET:  Low fat, low salt, diabetic diet.   WOUND CARE:  As per pacer instruction sheet.   FOLLOW-UP:  With Dr. Severiano Gilbert May 29, 2004 at 2:30.  See Dr. Clarene Duke June 04, 2004 at 3:30 p.m.   HISTORY OF PRESENT ILLNESS:  A 70 year old male had had increased fatigue,  decreased energy over the past month prior to his office visit of May 21, 2004.  He had no syncope, no presyncope.  Went to the [place] last week.  Heart  rate was 30 and his Lanoxin was stopped.  He was followed up with Dr. Clarene Duke  on May 21, 2004.  Found to be in complete heart block.  Due to his  cardiomyopathy he needs his Lanoxin and his  other medications for adequate  help.  Therefore, pacemaker implantation was planned.   PAST MEDICAL HISTORY:  1.  History of idiopathic cardiomyopathy, EF 24%, left bundle branch block,      mild coronary disease with 45% RCA stenosis.  Circumflex had minor      irregularities less than 15%, LAD was free of disease.  Left main was      normal.  He also had mild MR.  2.  History of myalgia with Vytorin.  3.  Dyslipidemia.  4.  Hypertension.  5.  Diabetes mellitus 2.  6.  History of ongoing tobacco use.  7.  History of depression.   ALLERGIES:  No known allergies.   FAMILY HISTORY:  See H&P.   SOCIAL HISTORY:  See H&P.   REVIEW OF SYSTEMS:  See H&P.   PHYSICAL EXAMINATION:  VITAL  SIGNS:  Blood pressure 156/86, pulse 69,  respirations 22, temperature 98, oxygen saturation room air 91%, Accu-Chek  121.  HEART:  S1, S2.  Regular rate and rhythm.  LUNGS:  Clear.  ABDOMEN:  Positive bowel sounds.  EXTREMITIES:  Without edema.  SKIN:  Pacemaker site itself had a small amount of swelling, one drop of  oozing blood, otherwise was stable.   LABORATORY DATA:  Hemoglobin 15.4, hematocrit 43.8, WBC 9.4, platelets 173.  Pro time 12.2, INR 0.9, PTT 26.  Sodium 136, potassium 4.7, chloride 107,  CO2 25, glucose 113, BUN 10, creatinine 1, calcium 8.9, total protein 6.3,  albumin 3.7.  TSH 1.866.  Chest x-ray follow-up after pacer implanted  satisfactory pacemaker placement.  There is no pneumothorax, COPD.  Leads in  the right atrium and right ventricle in good position.   HOSPITAL COURSE:  Mr. Goar was admitted on the 14th by Dr. Clarene Duke for  complete heart block.  Was seen in consultation by Dr. Launa Grill who felt  he was a class I indication for implantation of dual chamber pacemaker.  He  proceeded with that procedure which was done on May 22, 2004.  Patient  tolerated procedure well and by the 16th he was stable and ready for  discharge home.  No pneumothorax with his chest  x-ray.  His pacer  interrogation had done well and he would follow up with both Dr. Clarene Duke and  Dr. Severiano Gilbert as an outpatient.      Vernona Rieger   LRI/MEDQ  D:  09/03/2004  T:  09/03/2004  Job:  [id number]   cc:   Janeece Riggers. Severiano Gilbert, M.D.  Fax: [phone]   Wilson Singer, M.D.  [address]  [place]  [place] [address]  Fax: [phone]

## 2010-07-26 NOTE — Discharge Summary (Signed)
NAME:  Gregory Horne, Gregory Horne NO.:  [id number]   MEDICAL RECORD NO.:  [id number]          PATIENT TYPE:  IPS   LOCATION:  0305                          FACILITY:  [facility]   PHYSICIAN:  Anselm Jungling, MD  DATE OF BIRTH:  Jan 28, 1941   DATE OF ADMISSION:  12/25/2005  DATE OF DISCHARGE:  12/29/2005                                 DISCHARGE SUMMARY   IDENTIFYING DATA/REASON FOR ADMISSION:  The patient is a 70 year old married  white male who was admitted for severe depression, and suicide attempt that  he had made via overdose.  He came to [place] with a reported history of bipolar  disorder, and a recent manic episode.  Please refer to the admission note  for further details pertaining to the symptoms, circumstances and history  that led to his hospitalization.  He was given an initial Axis I diagnosis  of bipolar disorder, type 1, currently depressed without psychotic features.   MEDICAL AND LABORATORY:  The patient was medically and physically assessed  by the psychiatric nurse practitioner.  He came to [place] on a variety of non  psychotropic medications including glipizide for diabetes mellitus,  omeprazole for GERD, digoxin, aspirin, carvedilol, and simvastatin for his  cardiovascular disorders, Asmanex inhaler, and vitamin B1 and folic acid  supplements.  These medications were continued and monitored through his  stay.  He was showing very low blood sugar readings, so glipizide was held  during the latter part of his stay.  See discharge instructions.  There were  no acute medical issues.   HOSPITAL COURSE:  The patient was admitted to the adult inpatient  psychiatric service.  He presented as a well-nourished, well-developed male  who was quite disheveled, dysphoric, and depressed, although a very pleasant  man.  He was fully oriented.  There were no overtly delusional statements or  other evident suggestions of psychosis or thought disorder.  He was non  tremulous.  He  denied alcohol use.  He said his primary problem was that he  had not been sleeping.  He requested transfer to the [facility].   We learned that the [facility] would not accept him in transfer, and  the patient continued to stay with [place].   He came to [place] on a psychotropic regimen of Zoloft 100 mg daily and Xanax  0.25 mg b.i.d.  To better address his anxiety and sleeping difficulties  without further reliance on benzodiazepines, the patient was given a trial  of low-dose Risperdal at bedtime, which was somewhat helpful.  Xanax was not  continued.   The patient participated in various therapeutic groups and activities.  He  was given a brief trial of Remeron, but complained of dry mouth and it was  discontinued.  Trazodone was then utilized on a trial basis for sleep, but  this did not appear to be set successful either and he continued to complain  of dry mouth.   The patient experienced and expressed significant frustration over his  hospital stay, feeling that medications were not working well, and feeling  that he was  still not getting enough sleep.  He had difficulty accepting  some of the limitations of the unit, for instance not being allowed to shave  with a razor independently and having to wait to go outside.   On the fourth hospital day, there was a family session attended by the  patient and his son.  The patient's son offered full and complete support  for his father for help with his medication, medication management, therapy  and finances.  The patient requested referral for an individual therapist in  [place] part of [place].  The patient spoke about a solid plan to  maintain sobriety, and positive coping skills to use to help with stressors.  It appeared that the patient's son would indeed be able to provide adequate  support to help maintain the patient stability.   On the following day, the undersigned met with the patient.  He was  looking  much better, up, dressed, well-groomed, and well-organized.  He still  appeared mildly anxious, and still complained of dry mouth.  It was  difficult to identify anything in his medication regimen that would have  specifically resulted in such a problem with dry mouth.  He still felt that  he would like medication to help him sleep better.  At that point, he was  receiving Risperdal 0.5 mg at h.s.  Risperdal was therefore increased to 1  mg q.h.s.   The patient felt ready for discharge on the fifth hospital day.  He was  absent suicidal ideation and agreeable to the devised aftercare plan.   AFTERCARE:  The patient was to follow-up at [facility]  to see Gean Maidens, therapist, on January 06, 2006.  Also, for medication  management, he was to see Donnie Aho, nurse practitioner on January 08, 2006.  The patient was instructed to follow-up with his usual medical  physician, Dr. Karilyn Cota, for his medical management.   DISCHARGE MEDICATIONS:  1. Risperdal 1 mg q.h.s.  2. Zoloft 50 mg daily.  3. Thiamine 100 mg daily.  4. Folic acid 1 mg daily.  5. Lanoxin 0.125 mg each morning.  6. Aspirin 81 mg daily.  7. Coreg 12.5 mg daily.  8. Zocor 80 mg daily.  9. Protonix 40 mg daily.  10.Flovent 2 puffs twice daily as needed.  11.multivitamin daily.   The patient was instructed to not take a glipizide until he sees Dr.  Karilyn Cota, because of hypoglycemia.   DISCHARGE DIAGNOSES:  AXIS I:  Bipolar disorder, type 1, most recently  depressed with anxious features.  AXIS II:  Deferred.  AXIS III:  History of cardiovascular disease, cardiac insufficiency, asthma,  gastroesophageal reflux disease, diabetes mellitus.  AXIS IV:  Stressors severe.  AXIS V:  GAF on discharge 65.      Anselm Jungling, MD  Electronically Signed     SPB/MEDQ  D:  12/30/2005  T:  12/31/2005  Job:  [id number]

## 2010-07-26 NOTE — Discharge Summary (Signed)
NAME:  Gregory Horne, Gregory Horne NO.:  [id number]   MEDICAL RECORD NO.:  [id number]          PATIENT TYPE:  EMS   LOCATION:  MAJO                         FACILITY:  [facility]   PHYSICIAN:  Gregory I Elsaid, MD      DATE OF BIRTH:  04/21/1940   DATE OF ADMISSION:  12/25/2005  DATE OF DISCHARGE:  12/25/2005                                 DISCHARGE SUMMARY   DISCHARGE DIAGNOSES:  1. Chest pain, most probably due to anxiety.  2. Hypertension.  3. Diabetes.  4. Nonischemic cardiomyopathy with ejection fraction 24%.  5. COPD.  6. Complete heart block status post pacemaker.  7. Depression.  8. Chronic left bundle-branch block.   DISCHARGE MEDICATIONS:  1. Digoxin .125 mg p.o. daily.  2. Coreg 25 mg p.o. b.i.d.  3. Glipizide 10 mg p.o. daily.  4. Lipitor 80 mg p.o. q.h.s.  5. Furosemide 20 mg p.o. daily.  6. Ambien 12.5 mg p.o. q.h.s.  7. Aspirin 81 mg p.o. daily.  8. Xanax .25 mg p.o. b.i.d.  9. Folic acid 1 mg p.o. daily.  10.Thiamine 100 mg p.o. daily.  11.Librium 25 mg p.o. daily for two days.  12.Glipizide 10 mg p.o. daily.   Patient plans to follow with Dr. Clarene Duke, the cardiologist, on discharge in  the a.m. plus to see his primary care physician.   BRIEF HISTORY AND PHYSICAL:  Gregory Horne admitted on December 15, 2005 with a  brief history of forgetfulness for two weeks and chest pain for one day and  according to him, he had been in a lot of distress because of his mom's  sickness.  On admission, patient was found to have hyponatremia with a serum  sodium of 124, CK-MB and troponin were negative.  For alcohol abuse, we  started the patient on a tapered dose of Librium 25 mg p.o. b.i.d. with  folic acid and thiamine so tapered dose for Librium on discharge was 25 mg  p.o. daily for two days.  So, patient was discharged.  On discharge, his  vital signs were temperature 97.9, pulse rate 60, blood pressure 108/68 and  his labs, white blood cells 11, hemoglobin 12.8,  hematocrit 37.9 and  platelets 181, sodium 130/3.6, chloride 94, bicarb 31, BUN 11 and creatinine  .8 and glucose 129.   HOSPITAL COURSE:  1. Regarding the chest pain, cardiology was consulted by Dr. Clarene Duke where      he thinks that the chest pain was a history of nonischemic dilated      cardiomyopathy and he had recently six months ago had a stress test      which was negative.  He recommend patient can be discharged if      medically stable and dobutamine stress test to be arranged in his      office next week.  According Lovenox subcu therapeutic dose was D/C'd.  2. Hyponatremia.  On admission, the sodium was 124.  In my evaluation for      the patient, patient did not look to me on CHF.  He looked to me more  dehydrated picture so will start him on normal saline and  we keep      following the BmP everyday.  Serum sodium improved from 124 to 126, on      October 9 sodium improved to 134 and on discharge patient's sodium was      130.  We collected urine osmolality, serum osmolality was 257.      Gregory Bosie Helper, MD  Electronically Signed     HIE/MEDQ  D:  12/29/2005  T:  12/29/2005  Job:  [id number]

## 2010-08-09 ENCOUNTER — Encounter: Payer: Self-pay | Admitting: Internal Medicine

## 2010-08-28 ENCOUNTER — Encounter: Payer: Self-pay | Admitting: Internal Medicine

## 2010-09-03 ENCOUNTER — Encounter: Payer: Self-pay | Admitting: Internal Medicine

## 2011-09-01 IMAGING — CR DG CHEST 2V
2 series · 2 of 2 positions shown · non-contrast
Comparison: Portable chest x-ray of 05/29/2010

CLINICAL DATA: Post pacemaker insertion, shortness of breath, chest
soreness

CHEST - 2 VIEW

[w chest pa]
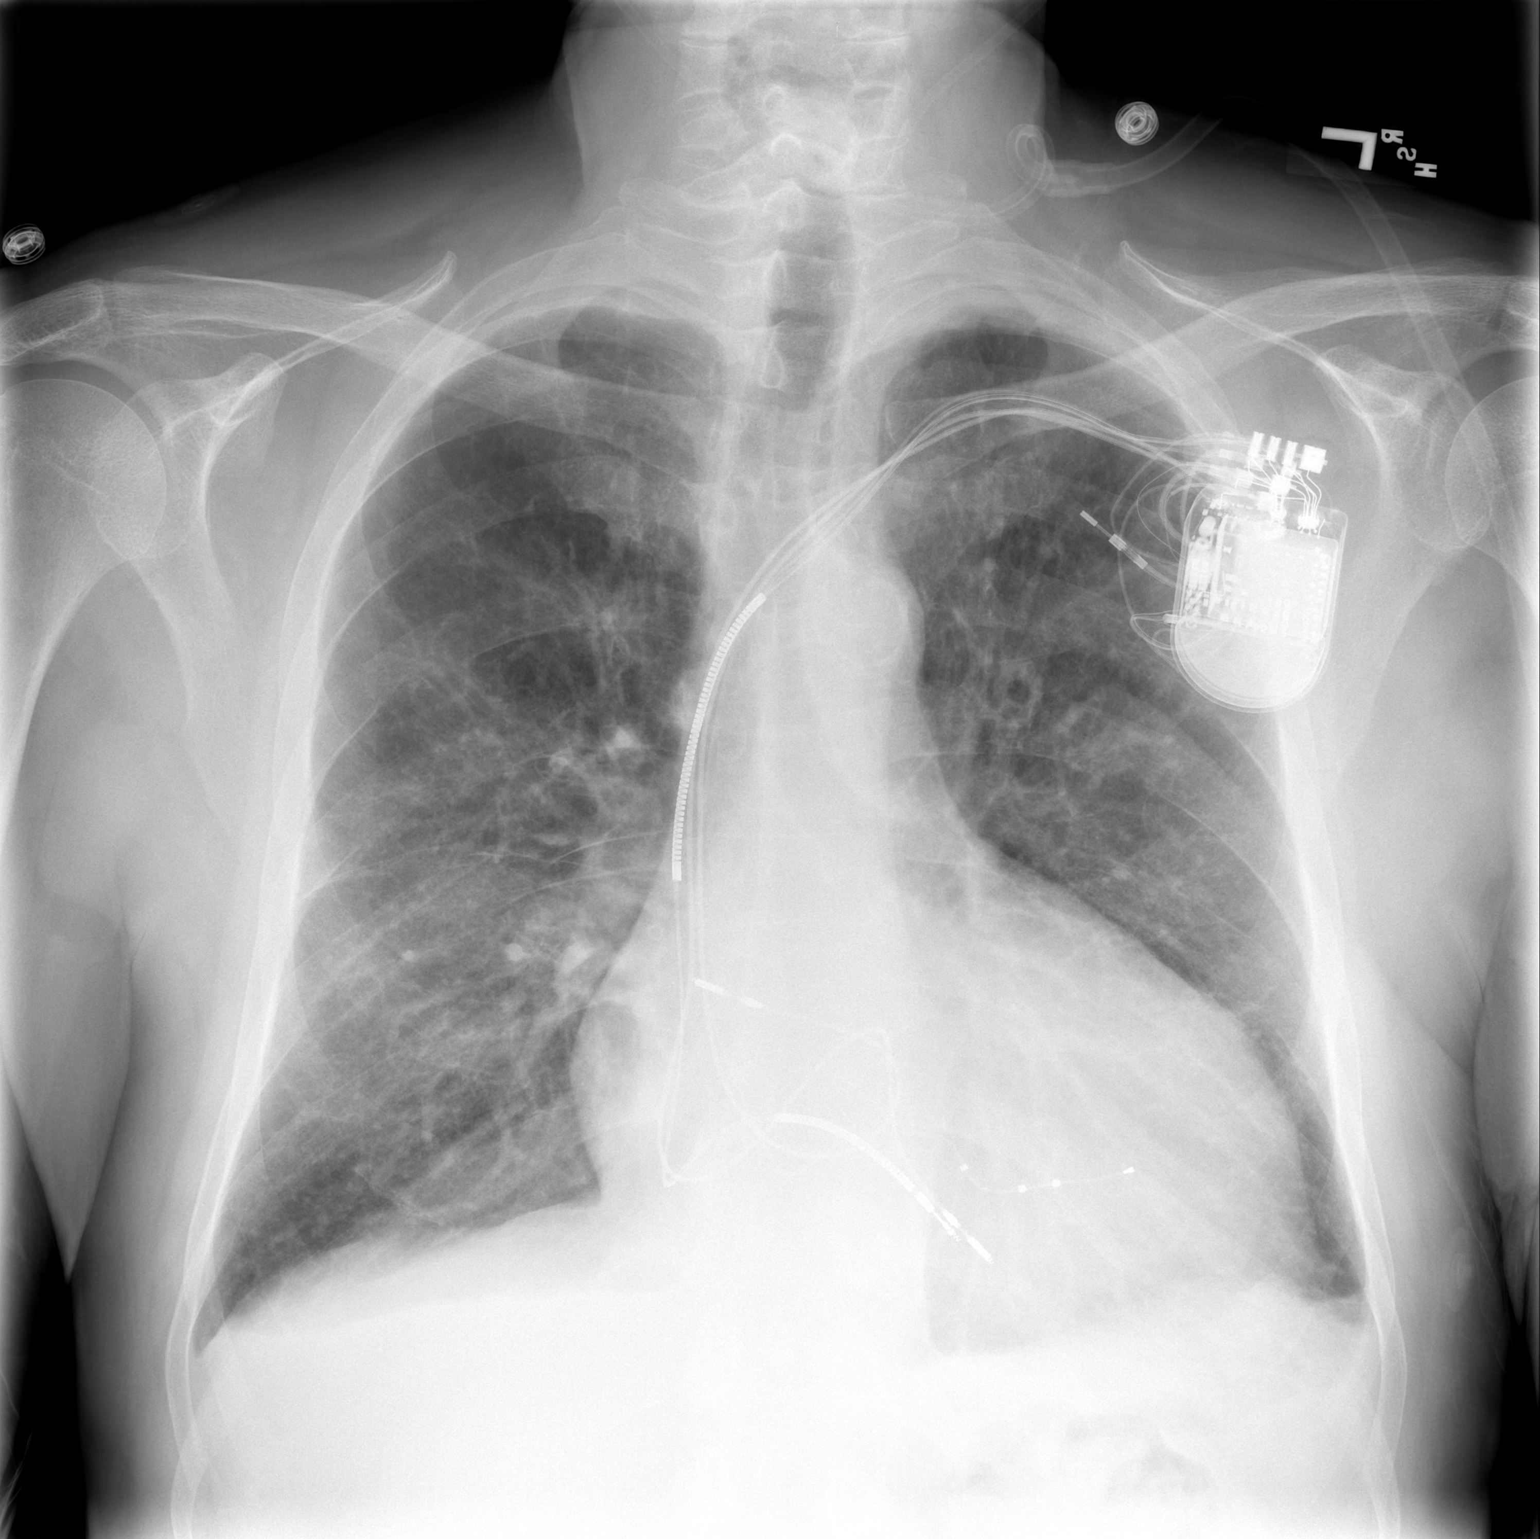

[w chest lat]
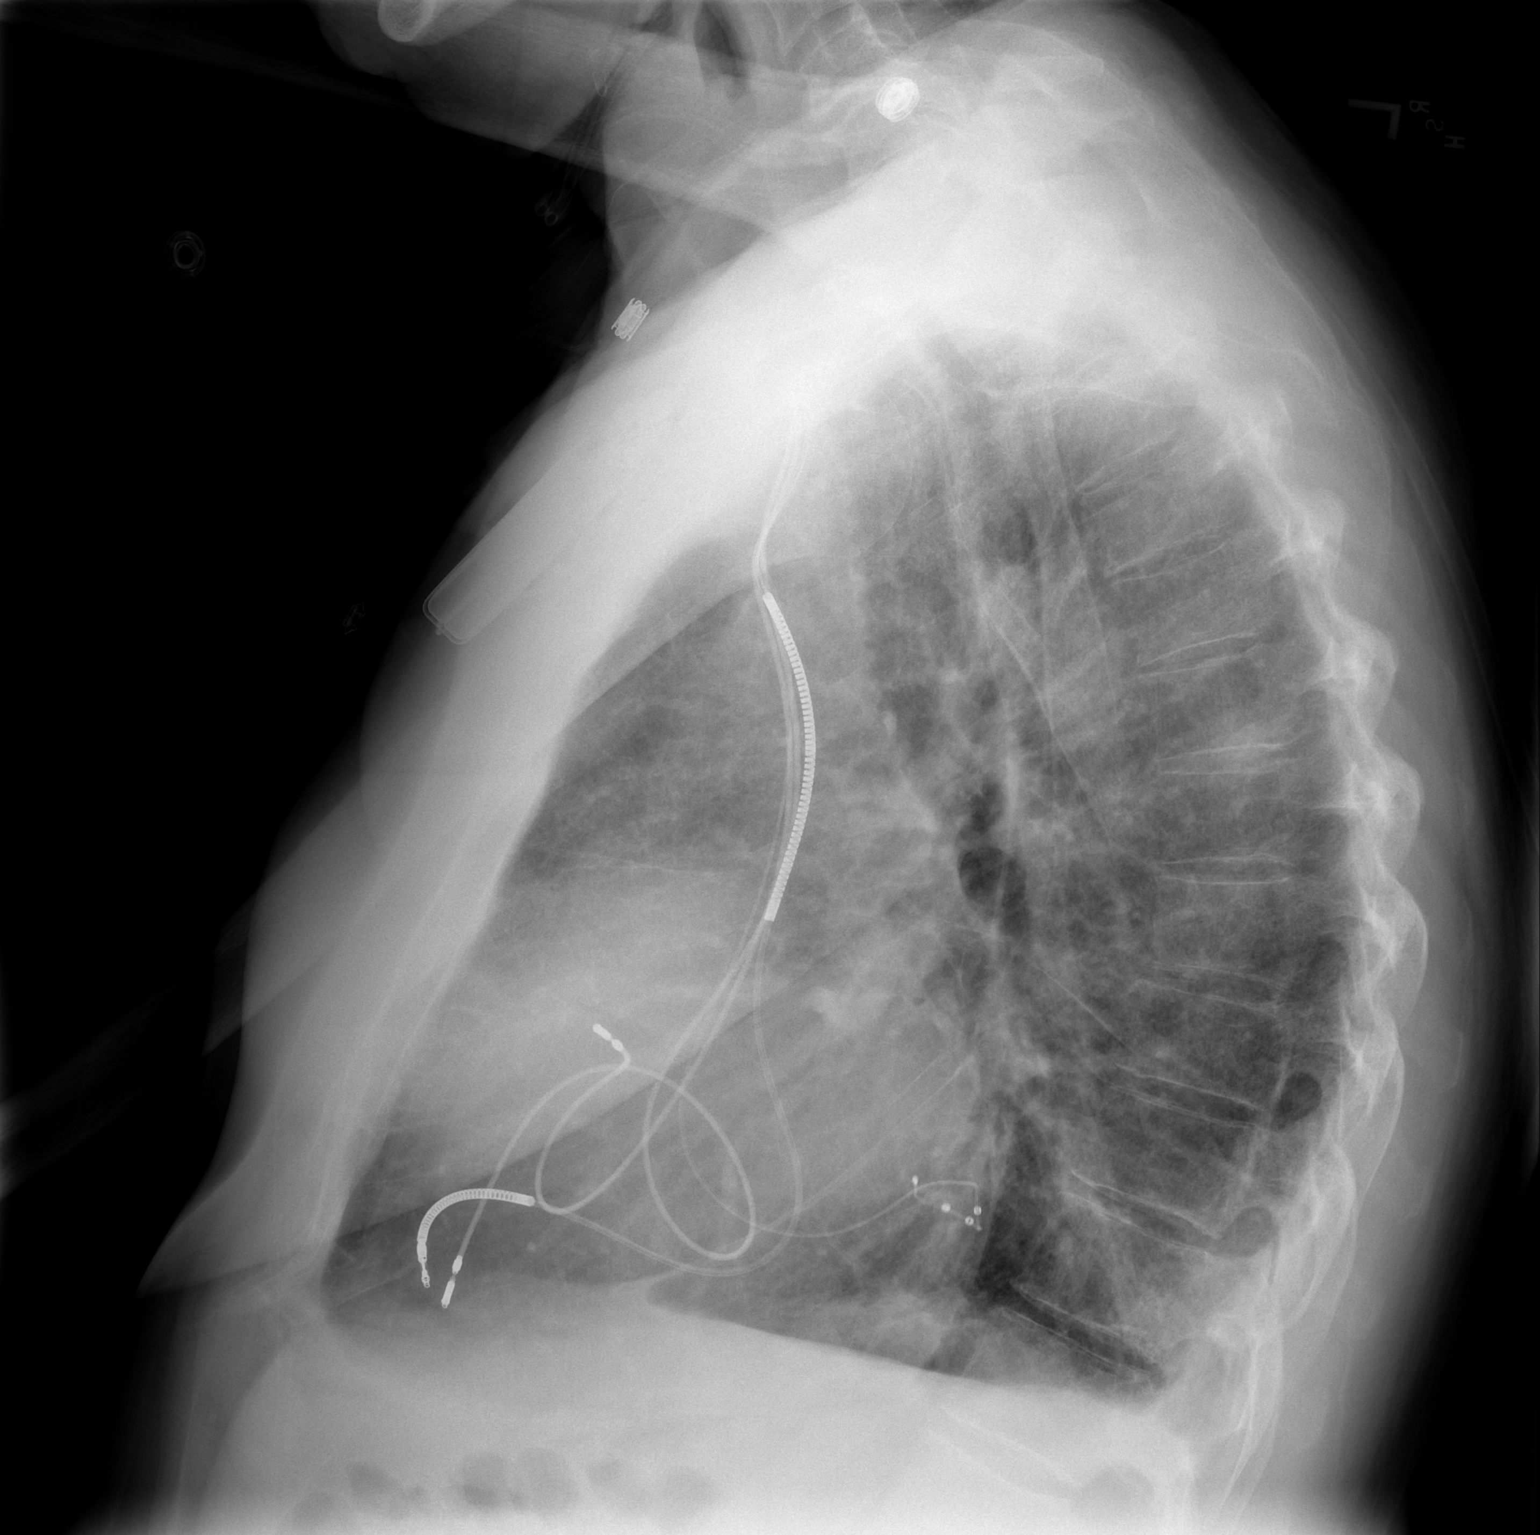

[2 of 2 positions shown; findings below may reference images not displayed]

FINDINGS: The lungs appear slightly better aerated.  There is mild
basilar atelectasis present.  Cardiomegaly is stable.  A pacer with
AICD lead is now present.  No pneumothorax is seen.  Biapical
pleural thickening is present.
IMPRESSION: Improved aeration.  Permanent pacer with AICD lead now present.  No
pneumothorax.

## 2013-08-17 ENCOUNTER — Encounter: Payer: Self-pay | Admitting: *Deleted

## 2013-08-18 ENCOUNTER — Telehealth: Payer: Self-pay | Admitting: Cardiology

## 2013-08-18 NOTE — Telephone Encounter (Signed)
Certified letter sent 

## 2017-02-07 DEATH — deceased
